# Patient Record
Sex: Female | Born: 1985 | Race: Black or African American | Hispanic: No | Marital: Single | Smoking: Never smoker
Health system: Southern US, Community
[De-identification: ages and names within clinical notes are randomized; demographics above are authoritative.]

## PROBLEM LIST (undated history)

## (undated) ENCOUNTER — Ambulatory Visit: Payer: PRIVATE HEALTH INSURANCE

## (undated) DIAGNOSIS — G43909 Migraine, unspecified, not intractable, without status migrainosus: Secondary | ICD-10-CM

---

## 2006-03-24 ENCOUNTER — Emergency Department (HOSPITAL_COMMUNITY): Admission: EM | Admit: 2006-03-24 | Discharge: 2006-03-24 | Payer: Self-pay | Admitting: Emergency Medicine

## 2009-04-22 ENCOUNTER — Encounter: Payer: Self-pay | Admitting: Obstetrics & Gynecology

## 2009-04-22 ENCOUNTER — Inpatient Hospital Stay (HOSPITAL_COMMUNITY): Admission: AD | Admit: 2009-04-22 | Discharge: 2009-04-22 | Payer: Self-pay | Admitting: Obstetrics

## 2009-05-20 ENCOUNTER — Ambulatory Visit (HOSPITAL_COMMUNITY): Admission: RE | Admit: 2009-05-20 | Discharge: 2009-05-20 | Payer: Self-pay | Admitting: Obstetrics & Gynecology

## 2009-07-08 ENCOUNTER — Inpatient Hospital Stay (HOSPITAL_COMMUNITY): Admission: AD | Admit: 2009-07-08 | Discharge: 2009-07-10 | Payer: Self-pay | Admitting: Obstetrics & Gynecology

## 2009-08-13 ENCOUNTER — Inpatient Hospital Stay (HOSPITAL_COMMUNITY): Admission: AD | Admit: 2009-08-13 | Discharge: 2009-08-13 | Payer: Self-pay | Admitting: Obstetrics

## 2010-01-02 IMAGING — US US OB FOLLOW-UP
1 series · 18 of 28 positions shown · non-contrast
Comparison: none

OBSTETRICAL ULTRASOUND:
 This ultrasound was performed in The [HOSPITAL], and the [facility] report will be stored to [REDACTED] PACS.

[Series 1: us ob follow-up · 18 of 46 slices shown]
[im 1/46]
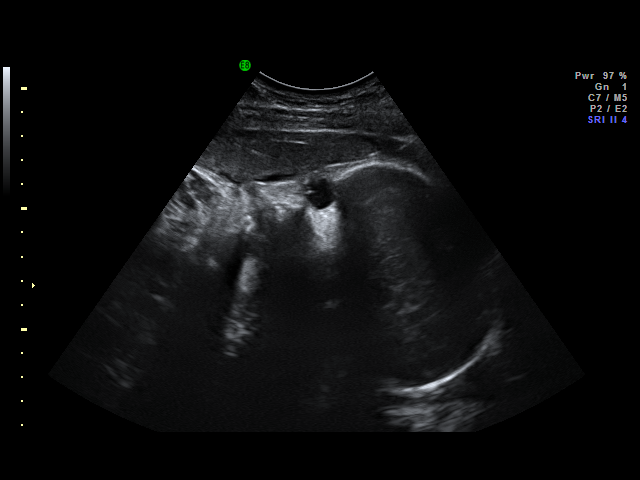
[im 4/46]
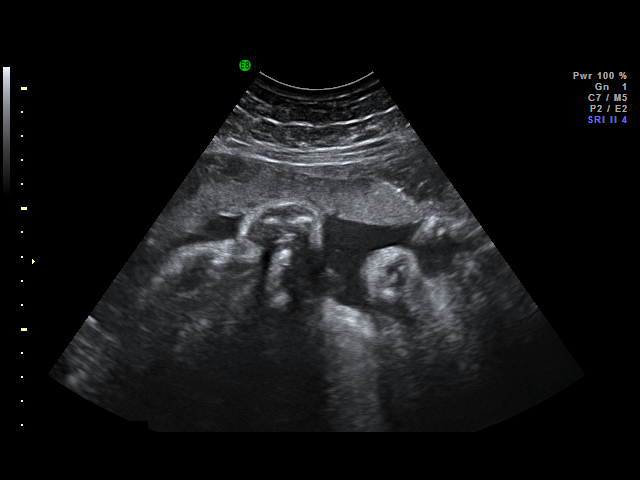
[im 6/46]
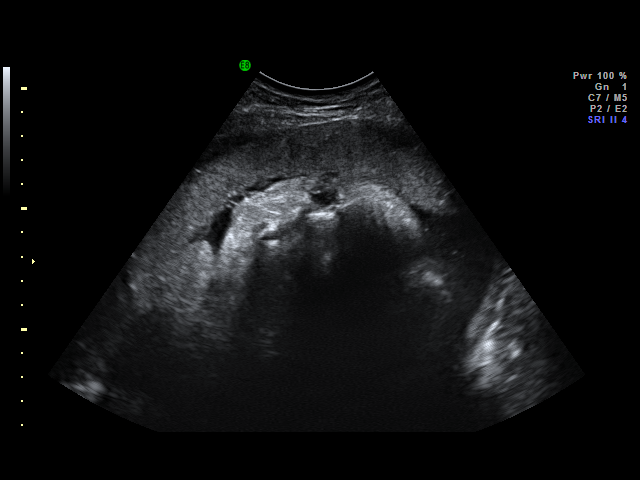
[im 9/46]
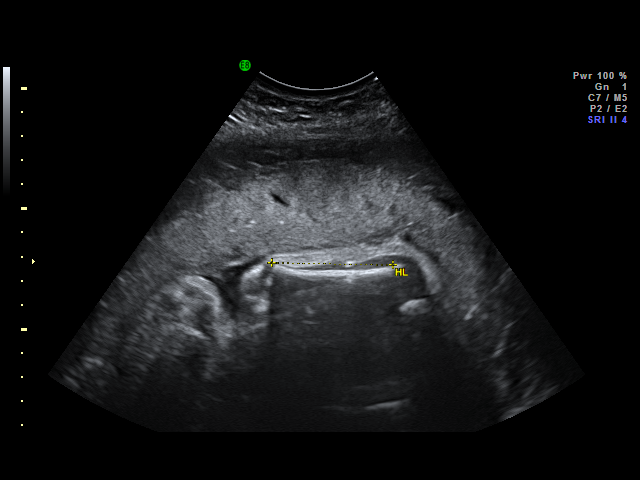
[im 12/46]
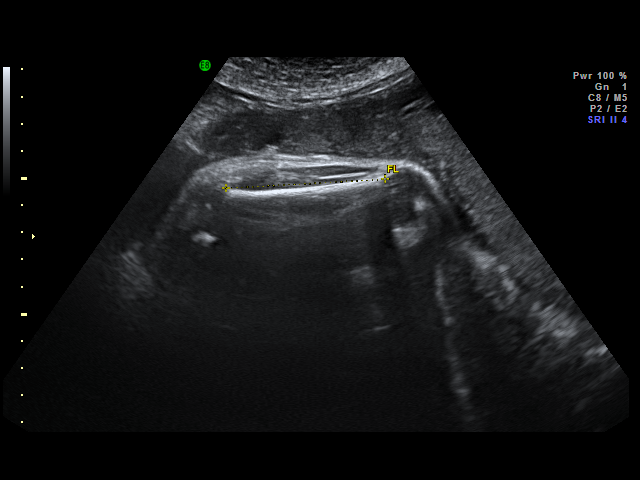
[im 14/46]
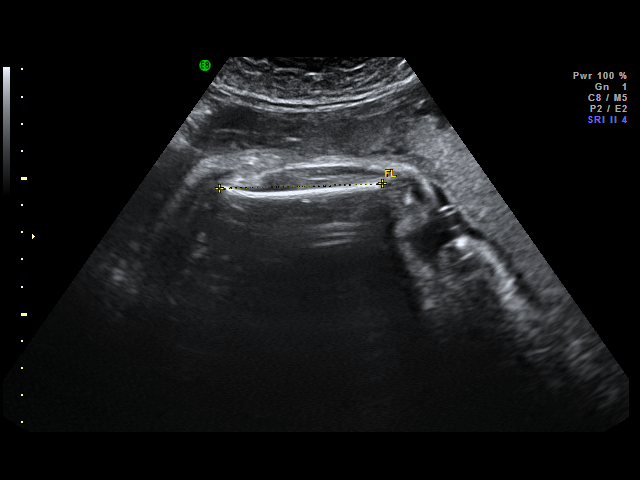
[im 17/46]
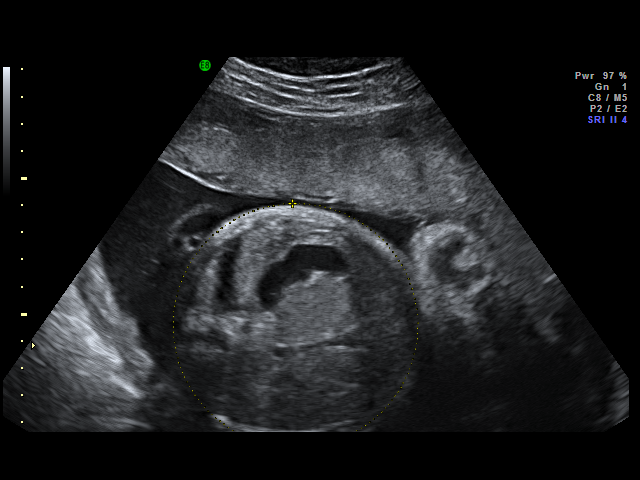
[im 19/46]
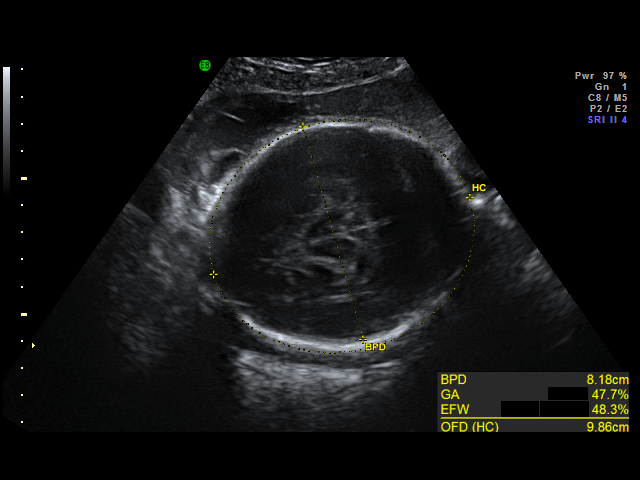
[im 22/46]
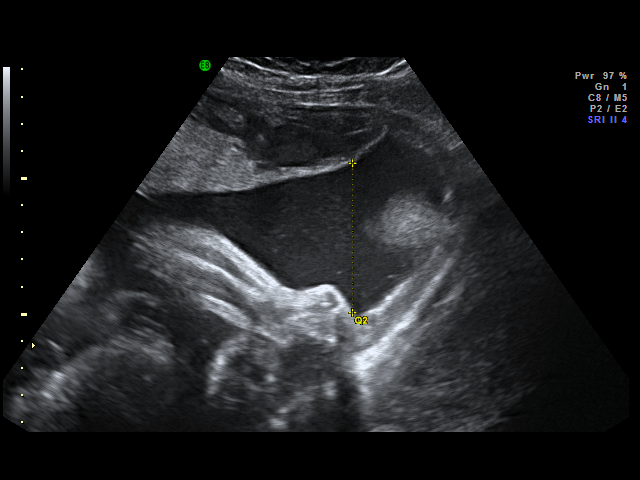
[im 24/46]
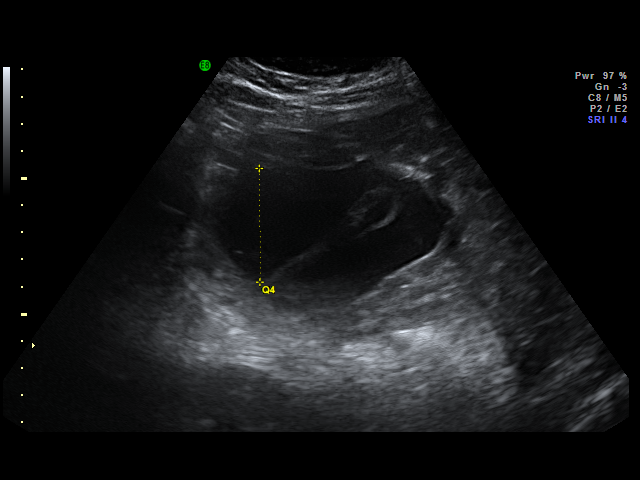
[im 27/46]
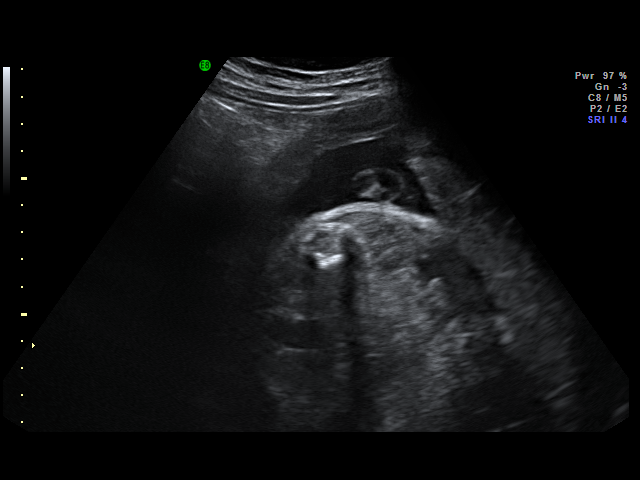
[im 29/46]
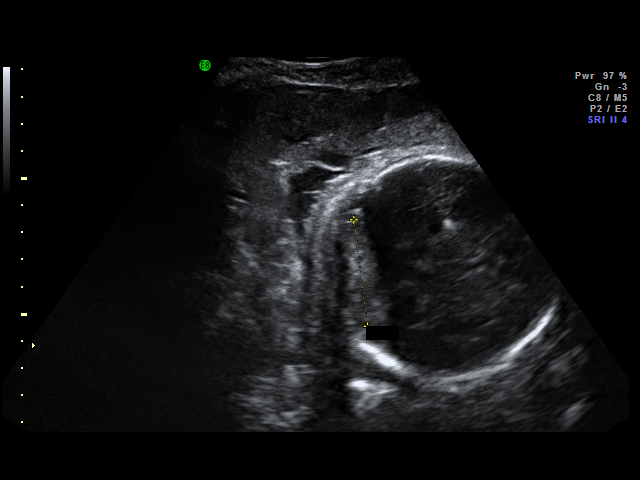
[im 32/46]
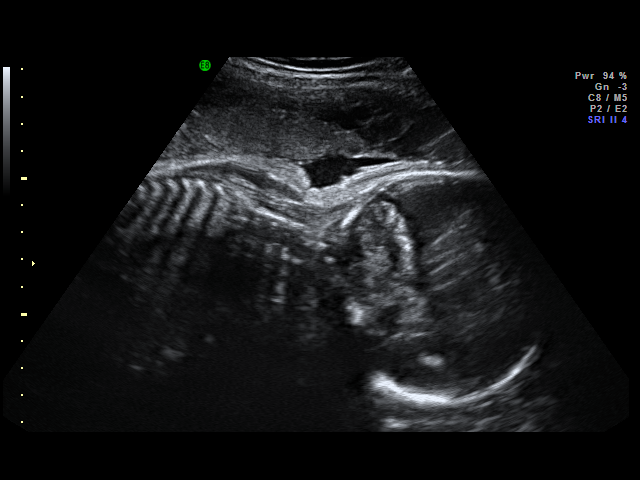
[im 36/46]
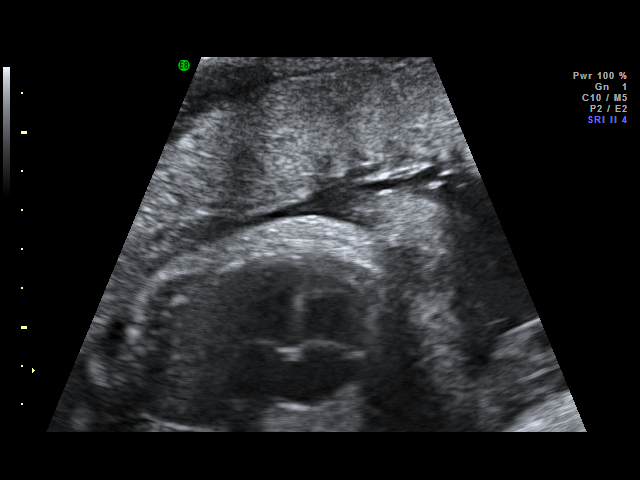
[im 37/46]
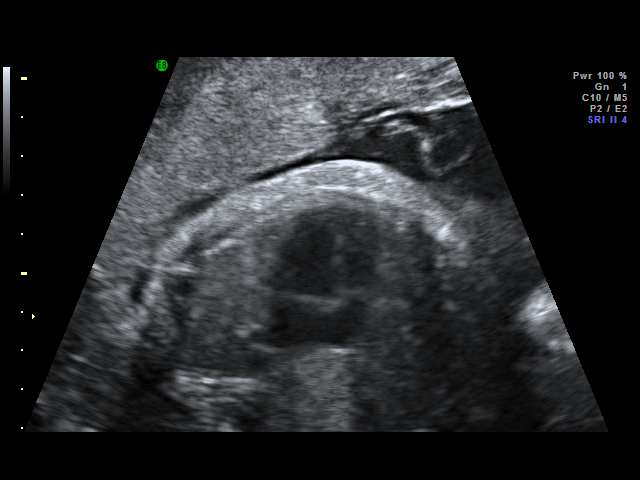
[im 41/46]
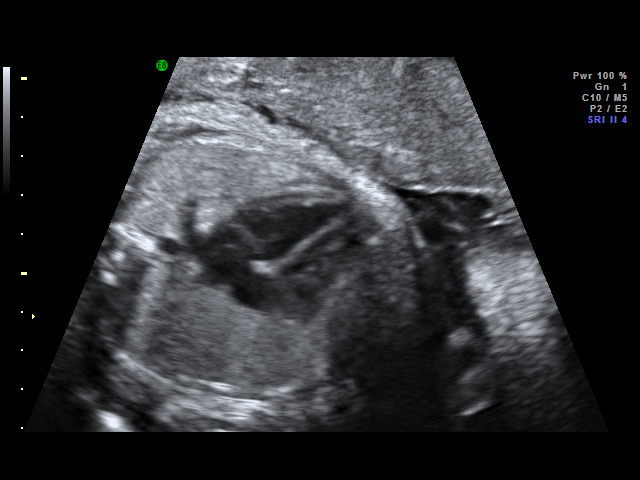
[im 42/46]
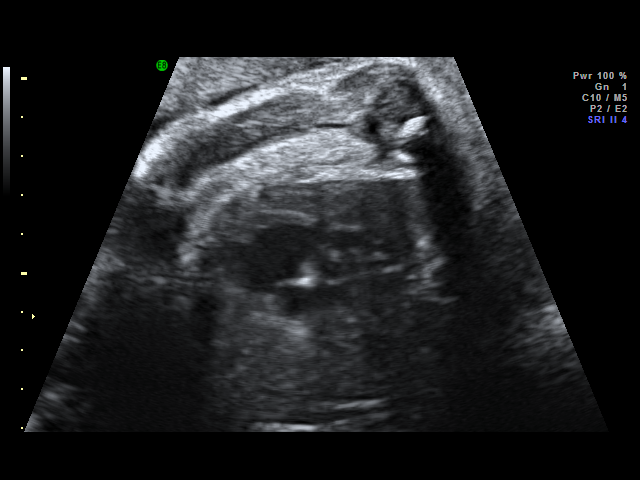
[im 46/46]
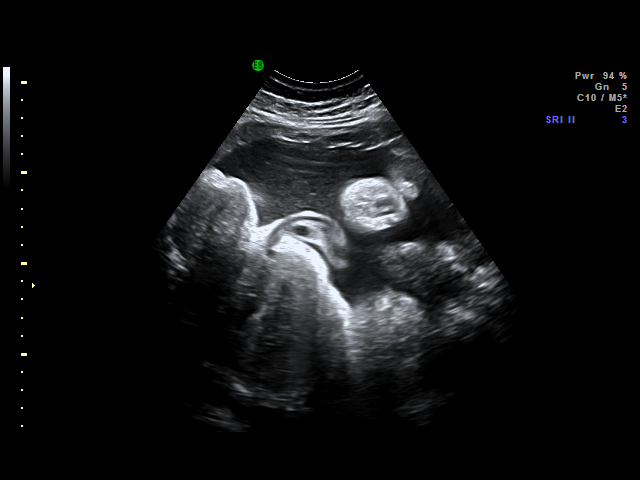

[18 of 28 positions shown; findings below may reference images not displayed]

IMPRESSION: [facility] has also been faxed to the ordering physician.

## 2010-02-25 ENCOUNTER — Emergency Department (HOSPITAL_COMMUNITY): Admission: EM | Admit: 2010-02-25 | Discharge: 2010-02-25 | Payer: Self-pay | Admitting: Family Medicine

## 2010-11-06 LAB — POCT URINALYSIS DIP (DEVICE)
Hgb urine dipstick: NEGATIVE
Protein, ur: 30 mg/dL — AB
Specific Gravity, Urine: 1.025 (ref 1.005–1.030)
Urobilinogen, UA: 1 mg/dL (ref 0.0–1.0)
pH: 5.5 (ref 5.0–8.0)

## 2010-11-06 LAB — WET PREP, GENITAL: Yeast Wet Prep HPF POC: NONE SEEN

## 2010-11-21 LAB — CBC
HCT: 37.3 % (ref 36.0–46.0)
MCHC: 32.5 g/dL (ref 30.0–36.0)
MCV: 90.5 fL (ref 78.0–100.0)
RBC: 4.12 MIL/uL (ref 3.87–5.11)
WBC: 6.7 10*3/uL (ref 4.0–10.5)

## 2010-11-23 LAB — CBC
HCT: 37.1 % (ref 36.0–46.0)
Hemoglobin: 9.8 g/dL — ABNORMAL LOW (ref 12.0–15.0)
MCV: 91.3 fL (ref 78.0–100.0)
Platelets: 283 10*3/uL (ref 150–400)
RBC: 3.22 MIL/uL — ABNORMAL LOW (ref 3.87–5.11)
RDW: 14 % (ref 11.5–15.5)
RDW: 14 % (ref 11.5–15.5)
WBC: 15.3 10*3/uL — ABNORMAL HIGH (ref 4.0–10.5)

## 2010-11-23 LAB — URIC ACID: Uric Acid, Serum: 4.7 mg/dL (ref 2.4–7.0)

## 2010-11-23 LAB — COMPREHENSIVE METABOLIC PANEL
BUN: 6 mg/dL (ref 6–23)
Calcium: 9.3 mg/dL (ref 8.4–10.5)
Creatinine, Ser: 0.62 mg/dL (ref 0.4–1.2)
Glucose, Bld: 68 mg/dL — ABNORMAL LOW (ref 70–99)
Total Protein: 6.6 g/dL (ref 6.0–8.3)

## 2010-11-23 LAB — LACTATE DEHYDROGENASE: LDH: 184 U/L (ref 94–250)

## 2011-03-13 ENCOUNTER — Emergency Department (HOSPITAL_COMMUNITY)
Admission: EM | Admit: 2011-03-13 | Discharge: 2011-03-14 | Discharge status: Against Medical Advice | Payer: Self-pay | Attending: Emergency Medicine | Admitting: Emergency Medicine

## 2011-03-13 DIAGNOSIS — Z0389 Encounter for observation for other suspected diseases and conditions ruled out: Secondary | ICD-10-CM | POA: Insufficient documentation

## 2011-03-13 LAB — URINALYSIS, ROUTINE W REFLEX MICROSCOPIC
Glucose, UA: NEGATIVE mg/dL
Protein, ur: NEGATIVE mg/dL
pH: 5.5 (ref 5.0–8.0)

## 2011-03-13 LAB — URINE MICROSCOPIC-ADD ON

## 2011-03-14 ENCOUNTER — Inpatient Hospital Stay (INDEPENDENT_AMBULATORY_CARE_PROVIDER_SITE_OTHER)
Admission: RE | Admit: 2011-03-14 | Discharge: 2011-03-14 | Disposition: A | Discharge status: Home / Self Care | Payer: 59 | Source: Ambulatory Visit | Attending: Emergency Medicine | Admitting: Emergency Medicine

## 2011-03-14 ENCOUNTER — Ambulatory Visit (INDEPENDENT_AMBULATORY_CARE_PROVIDER_SITE_OTHER): Payer: 59

## 2011-03-14 DIAGNOSIS — R109 Unspecified abdominal pain: Secondary | ICD-10-CM

## 2011-03-14 LAB — POCT URINALYSIS DIP (DEVICE)
Ketones, ur: NEGATIVE mg/dL
Leukocytes, UA: NEGATIVE
Protein, ur: 100 mg/dL — AB
pH: 6 (ref 5.0–8.0)

## 2011-03-14 LAB — WET PREP, GENITAL: Clue Cells Wet Prep HPF POC: NONE SEEN

## 2011-12-04 ENCOUNTER — Telehealth: Payer: Self-pay | Admitting: Obstetrics and Gynecology

## 2011-12-04 NOTE — Telephone Encounter (Signed)
Routed to triage- 5:15

## 2011-12-05 ENCOUNTER — Telehealth: Payer: Self-pay | Admitting: Obstetrics and Gynecology

## 2011-12-07 NOTE — Telephone Encounter (Signed)
TC to pt. LM to return call. LM appt. Available this AM.

## 2011-12-13 NOTE — Telephone Encounter (Signed)
No response from pt. F/U ended. 

## 2013-02-02 ENCOUNTER — Emergency Department (HOSPITAL_COMMUNITY)
Admission: EM | Admit: 2013-02-02 | Discharge: 2013-02-02 | Disposition: A | Discharge status: Home / Self Care | Payer: Self-pay | Attending: Emergency Medicine | Admitting: Emergency Medicine

## 2013-02-02 ENCOUNTER — Encounter (HOSPITAL_COMMUNITY): Payer: Self-pay | Admitting: *Deleted

## 2013-02-02 DIAGNOSIS — M542 Cervicalgia: Secondary | ICD-10-CM | POA: Insufficient documentation

## 2013-02-02 DIAGNOSIS — J020 Streptococcal pharyngitis: Secondary | ICD-10-CM | POA: Insufficient documentation

## 2013-02-02 DIAGNOSIS — R1310 Dysphagia, unspecified: Secondary | ICD-10-CM | POA: Insufficient documentation

## 2013-02-02 DIAGNOSIS — R599 Enlarged lymph nodes, unspecified: Secondary | ICD-10-CM | POA: Insufficient documentation

## 2013-02-02 DIAGNOSIS — H9209 Otalgia, unspecified ear: Secondary | ICD-10-CM | POA: Insufficient documentation

## 2013-02-02 LAB — RAPID STREP SCREEN (MED CTR MEBANE ONLY): Streptococcus, Group A Screen (Direct): POSITIVE — AB

## 2013-02-02 MED ORDER — HYDROCODONE-HOMATROPINE 5-1.5 MG/5ML PO SYRP: 2.5000 mL | ORAL_SOLUTION | Freq: Four times a day (QID) | ORAL | Status: DC | PRN

## 2013-02-02 MED ORDER — PENICILLIN G BENZATHINE 1200000 UNIT/2ML IM SUSP
1.2000 10*6.[IU] | Freq: Once | INTRAMUSCULAR | Status: AC
  Administered 2013-02-02: 1.2 10*6.[IU] via INTRAMUSCULAR
  Filled 2013-02-02: qty 2

## 2013-02-02 MED ORDER — DEXAMETHASONE SODIUM PHOSPHATE 10 MG/ML IJ SOLN
10.0000 mg | Freq: Once | INTRAMUSCULAR | Status: AC
  Administered 2013-02-02: 10 mg via INTRAMUSCULAR
  Filled 2013-02-02: qty 1

## 2013-02-02 NOTE — ED Notes (Signed)
Pt c/o swelling right side of neck x 3 days; earache today; pain with swallowing

## 2013-02-02 NOTE — ED Provider Notes (Signed)
History     CSN: [id number]  Arrival date & time 02/02/13  0534   First MD Initiated Contact with Patient 02/02/13 [phone]      Chief Complaint  Patient presents with  . Neck Pain    (Consider location/radiation/quality/duration/timing/severity/associated sxs/prior treatment) HPI  Patient is a 27 yo female presented to the emergency room for 3 days of worsening sore throat with right ear pain. Rates pain 9/10. Swallowing and eating aggravate pain. No alleviating factors including OTC medications. Denies fevers, chills, nausea, vomiting, cough.   History reviewed. No pertinent past medical history.  History reviewed. No pertinent past surgical history.  No family history on file.  History  Substance Use Topics  . Smoking status: Never Smoker   . Smokeless tobacco: Not on file  . Alcohol Use: No    OB History   Grav Para Term Preterm Abortions TAB SAB Ect Mult Living                  Review of Systems  Constitutional: Negative for fever and chills.  HENT: Positive for ear pain and sore throat. Negative for congestion, rhinorrhea, drooling, trouble swallowing, neck pain, neck stiffness, voice change and sinus pressure.   Respiratory: Negative for cough.   Neurological: Negative for headaches.    Allergies  Review of patient's allergies indicates no known allergies.  Home Medications   Current Outpatient Rx  Name  Route  Sig  Dispense  Refill  . Aspirin-Salicylamide-Caffeine (BC HEADACHE POWDER PO)   Oral   Take 1 Package by mouth 2 (two) times daily as needed (pain or headache).         Marland Kitchen ibuprofen (ADVIL,MOTRIN) 800 MG tablet   Oral   Take 800 mg by mouth every 8 (eight) hours as needed for pain.         . valACYclovir (VALTREX) 1000 MG tablet   Oral   Take 500 mg by mouth every morning.         Marland Kitchen HYDROcodone-homatropine (HYCODAN) 5-1.5 MG/5ML syrup   Oral   Take 2.5 mLs by mouth every 6 (six) hours as needed for cough.   120 mL   0     BP  125/70  Pulse 79  Temp(Src) 98.3 F (36.8 C) (Oral)  Resp 18  SpO2 100%  Physical Exam  Constitutional: She is oriented to person, place, and time. She appears well-developed and well-nourished. No distress.  HENT:  Head: Normocephalic and atraumatic. No trismus in the jaw.  Mouth/Throat: Uvula is midline and mucous membranes are normal. Normal dentition. No edematous. Posterior oropharyngeal erythema present. No oropharyngeal exudate, posterior oropharyngeal edema or tonsillar abscesses.  Eyes: Conjunctivae are normal.  Neck: Neck supple.  Lymphadenopathy:    She has cervical adenopathy.  Neurological: She is alert and oriented to person, place, and time.  Skin: Skin is warm and dry. She is not diaphoretic.  Psychiatric: She has a normal mood and affect.    ED Course  Procedures (including critical care time)  Medications  penicillin g benzathine (BICILLIN LA) 1200000 UNIT/2ML injection 1.2 Million Units (1.2 Million Units Intramuscular Given 02/02/13 0713)  dexamethasone (DECADRON) injection 10 mg (10 mg Intramuscular Given 02/02/13 0713)     Labs Reviewed  RAPID STREP SCREEN - Abnormal; Notable for the following:    Streptococcus, Group A Screen (Direct) POSITIVE (*)    All other components within normal limits   No results found.   1. Strep pharyngitis  MDM  Pt febrile with cervical lymphadenopathy, & dysphagia; diagnosis of strep. Treated in the Ed with steroids, NSAIDs, Pain medication and PCN IM.  Pt appears mildly dehydrated, discussed importance of water rehydration. Presentation non concerning for PTA or infxn spread to soft tissue. No trismus or uvula deviation. Specific return precautions discussed. Pt able to drink water in ED without difficulty with intact air way. Recommended PCP follow up. Patient is stable at time of discharge          Jeannetta Ellis, PA-C 02/02/13 1610

## 2013-02-02 NOTE — ED Notes (Signed)
Strep test sent to lab

## 2013-02-02 NOTE — ED Notes (Signed)
Pt states she has sore throat on right side and her right ear is now hurting and it is hard to swallow,  Pain 10/10

## 2013-02-03 NOTE — ED Provider Notes (Signed)
Medical screening examination/treatment/procedure(s) were performed by non-physician practitioner and as supervising physician I was immediately available for consultation/collaboration.   Gavin Pound. Oletta Lamas, MD 02/03/13 [phone]

## 2015-12-09 ENCOUNTER — Emergency Department (HOSPITAL_COMMUNITY)
Admission: EM | Admit: 2015-12-09 | Discharge: 2015-12-09 | Disposition: A | Discharge status: Home / Self Care | Payer: BLUE CROSS/BLUE SHIELD | Attending: Emergency Medicine | Admitting: Emergency Medicine

## 2015-12-09 ENCOUNTER — Emergency Department (HOSPITAL_COMMUNITY): Payer: BLUE CROSS/BLUE SHIELD

## 2015-12-09 ENCOUNTER — Encounter (HOSPITAL_COMMUNITY): Payer: Self-pay | Admitting: Emergency Medicine

## 2015-12-09 DIAGNOSIS — G43809 Other migraine, not intractable, without status migrainosus: Secondary | ICD-10-CM | POA: Insufficient documentation

## 2015-12-09 DIAGNOSIS — R51 Headache: Secondary | ICD-10-CM | POA: Diagnosis present

## 2015-12-09 DIAGNOSIS — Z3202 Encounter for pregnancy test, result negative: Secondary | ICD-10-CM | POA: Insufficient documentation

## 2015-12-09 LAB — I-STAT BETA HCG BLOOD, ED (MC, WL, AP ONLY): I-stat hCG, quantitative: <5 m[IU]/mL (ref <5)

## 2015-12-09 MED ORDER — DIPHENHYDRAMINE HCL 50 MG/ML IJ SOLN
12.5000 mg | Freq: Once | INTRAMUSCULAR | Status: AC
  Administered 2015-12-09: 12.5 mg via INTRAVENOUS
  Filled 2015-12-09: qty 1

## 2015-12-09 MED ORDER — SUMATRIPTAN SUCCINATE 50 MG PO TABS: 50.0000 mg | ORAL_TABLET | Freq: Every day | ORAL | Status: DC | PRN

## 2015-12-09 MED ORDER — PROCHLORPERAZINE EDISYLATE 5 MG/ML IJ SOLN
10.0000 mg | Freq: Once | INTRAMUSCULAR | Status: AC
  Administered 2015-12-09: 10 mg via INTRAVENOUS
  Filled 2015-12-09: qty 2

## 2015-12-09 MED ORDER — KETOROLAC TROMETHAMINE 30 MG/ML IJ SOLN
30.0000 mg | Freq: Once | INTRAMUSCULAR | Status: AC
  Administered 2015-12-09: 30 mg via INTRAVENOUS
  Filled 2015-12-09: qty 1

## 2015-12-09 MED ORDER — SODIUM CHLORIDE 0.9 % IV SOLN
INTRAVENOUS | Status: DC
  Administered 2015-12-09: 125 mL/h via INTRAVENOUS

## 2015-12-09 NOTE — ED Notes (Signed)
Patient states "bad headache x 2 days".  Patient states gets "bad headaches 2 x weekly for 6 years".  Patient states has not seen a doctor for same.  Patient states she has been taking bc powders at home but gets no relief from pain.   Patient states nausea, lights hurts her eyes, noise bothers her.   Patient states has never been diagnosed with migraines.

## 2015-12-09 NOTE — ED Provider Notes (Signed)
CSN: [id number]     Arrival date & time 12/09/15  [id number] History   First MD Initiated Contact with Patient 12/09/15 [phone]     Chief Complaint  Patient presents with  . Headache   HPI Patient presents to the emergency room for complaints of a headache. The patient has had trouble with headaches at least twice weekly for the past 6 years. Patient has tried over-the-counter medications. I offer some relief however the headaches continue to return. Over the last couple of days the headaches have increased in intensity. His had some trouble with nausea, photophobia and phonophobia. She denies any numbness or weakness. No fevers or chills. No neck pain or stiffness. No recent injuries. Patient has not seen a doctor in the past because she is afraid of what they might say. History reviewed. No pertinent past medical history. History reviewed. No pertinent past surgical history. No family history on file. Social History  Substance Use Topics  . Smoking status: Never Smoker   . Smokeless tobacco: None  . Alcohol Use: Yes     Comment: socially   OB History    No data available     Review of Systems  All other systems reviewed and are negative.     Allergies  Review of patient's allergies indicates no known allergies.  Home Medications   Prior to Admission medications   Medication Sig Start Date End Date Taking? Authorizing Provider  Aspirin-Salicylamide-Caffeine (BC HEADACHE POWDER PO) Take 1 Package by mouth 2 (two) times daily as needed (pain or headache).   Yes Historical Provider, MD  ibuprofen (ADVIL,MOTRIN) 800 MG tablet Take 800 mg by mouth every 8 (eight) hours as needed for pain.   Yes Historical Provider, MD  SUMAtriptan (IMITREX) 50 MG tablet Take 1 tablet (50 mg total) by mouth daily as needed for migraine. May repeat once in 2 hours if headache persists or recurs. 12/09/15   Linwood DibblesJon Wendee Hata, MD   BP 107/63 mmHg  Pulse 69  Temp(Src) 98.3 F (36.8 C) (Oral)  Resp 17  Ht 5\' 6"   (1.676 m)  Wt 83.915 kg  BMI 29.87 kg/m2  SpO2 99%  LMP 12/02/2015 Physical Exam  Constitutional: She is oriented to person, place, and time. She appears well-developed and well-nourished. No distress.  HENT:  Head: Normocephalic and atraumatic.  Right Ear: External ear normal.  Left Ear: External ear normal.  Mouth/Throat: Oropharynx is clear and moist.  Eyes: Conjunctivae are normal. Right eye exhibits no discharge. Left eye exhibits no discharge. No scleral icterus.  Neck: Normal range of motion. Neck supple. No tracheal deviation present.  Cardiovascular: Normal rate, regular rhythm and intact distal pulses.   Pulmonary/Chest: Effort normal and breath sounds normal. No stridor. No respiratory distress. She has no wheezes. She has no rales.  Abdominal: Soft. Bowel sounds are normal. She exhibits no distension. There is no tenderness. There is no rebound and no guarding.  Musculoskeletal: She exhibits no edema or tenderness.  Neurological: She is alert and oriented to person, place, and time. She has normal strength. No cranial nerve deficit (No facial droop, extraocular movements intact, tongue midline ) or sensory deficit. She exhibits normal muscle tone. She displays no seizure activity. Coordination normal.  No pronator drift bilateral upper extrem, able to hold both legs off bed for 5 seconds, sensation intact in all extremities, no visual field cuts, no left or right sided neglect, normal finger-nose exam bilaterally, no nystagmus noted   Skin: Skin is warm and dry.  No rash noted.  Psychiatric: She has a normal mood and affect.  Nursing note and vitals reviewed.   ED Course  Procedures (including critical care time) Labs Review Labs Reviewed  I-STAT BETA HCG BLOOD, ED ([facility], [facility], [facility] ONLY)    Imaging Review Ct Head Wo Contrast  12/09/2015  CLINICAL DATA:  Headache for 2 days, history of migraines EXAM: CT HEAD WITHOUT CONTRAST TECHNIQUE: Contiguous axial images were obtained  from the base of the skull through the vertex without intravenous contrast. COMPARISON:  None. FINDINGS: No mass lesion. No midline shift. No acute hemorrhage or hematoma. No extra-axial fluid collections. No evidence of acute infarction. Calvarium intact. Visualized portions of the paranasal sinuses clear. IMPRESSION: Normal head CT Electronically Signed   By: Esperanza Heir M.D.   On: 12/09/2015 10:43   I have personally reviewed and evaluated these images and lab results as part of my medical decision-making.  Medications  0.9 %  sodium chloride infusion (125 mL/hr Intravenous New Bag/Given 12/09/15 1006)  prochlorperazine (COMPAZINE) injection 10 mg (10 mg Intravenous Given 12/09/15 1012)  diphenhydrAMINE (BENADRYL) injection 12.5 mg (12.5 mg Intravenous Given 12/09/15 1010)  ketorolac (TORADOL) 30 MG/ML injection 30 mg (30 mg Intravenous Given 12/09/15 1012)     MDM   Final diagnoses:  Other migraine without status migrainosus, not intractable    CT scan of the brain was performed because she's never had any imaging despite these chronic headaches for several years. CT scan does not show any evidence of tumor or hemorrhage.  Patient's symptoms are consistent with a migraine-type headache. Symptoms improved with IV medications. We'll discharge home with a prescription for Imitrex. I suggested follow-up with a primary care doctor for further treatment of her headaches. I discussed the nature of migraine treatment and the variety of medications that can be used.   Linwood Dibbles, MD 12/09/15 1130

## 2015-12-09 NOTE — Discharge Instructions (Signed)

## 2016-01-29 ENCOUNTER — Ambulatory Visit (INDEPENDENT_AMBULATORY_CARE_PROVIDER_SITE_OTHER): Payer: BLUE CROSS/BLUE SHIELD | Admitting: Osteopathic Medicine

## 2016-01-29 VITALS — BP 110/68 | HR 77 | Temp 98.6°F | Resp 16 | Ht 65.75 in | Wt 183.0 lb

## 2016-01-29 DIAGNOSIS — K029 Dental caries, unspecified: Secondary | ICD-10-CM | POA: Diagnosis not present

## 2016-01-29 MED ORDER — KETOROLAC TROMETHAMINE 60 MG/2ML IM SOLN
60.0000 mg | Freq: Once | INTRAMUSCULAR | Status: AC
  Administered 2016-01-29: 60 mg via INTRAMUSCULAR

## 2016-01-29 MED ORDER — TRAMADOL HCL 50 MG PO TABS: 50.0000 mg | ORAL_TABLET | Freq: Four times a day (QID) | ORAL | Status: AC | PRN

## 2016-01-29 MED ORDER — TRAMADOL HCL 50 MG PO TABS: 50.0000 mg | ORAL_TABLET | Freq: Four times a day (QID) | ORAL | Status: DC | PRN

## 2016-01-29 NOTE — Progress Notes (Signed)
HPI: Alexis Leonard is a 30 y.o. female who presents to [facility] today for chief complaint of:  Chief Complaint  Patient presents with  . Dental Pain    Right side, supposed to have tooth removed this upcoming Tuesday    Dental pain . Location: R upper side of mouth . Quality: throbbing pain, sharp . Duration: Several days . Timing: Worse with food or drink . Modifying factors: Has tried BC powders, Goody powders, no effect . Assoc signs/symptoms: No fever or chills, no swelling or drainage from the gums, no sore throat or enlarged lymph nodes   Past medical, social and family history reviewed: No past medical history on file. No past surgical history on file. Social History  Substance Use Topics  . Smoking status: Never Smoker   . Smokeless tobacco: Not on file  . Alcohol Use: Yes     Comment: socially   No family history on file.  Current Outpatient Prescriptions  Medication Sig Dispense Refill  . Aspirin-Salicylamide-Caffeine (BC HEADACHE POWDER PO) Take 1 Package by mouth 2 (two) times daily as needed (pain or headache). Reported on 01/29/2016    . ibuprofen (ADVIL,MOTRIN) 800 MG tablet Take 800 mg by mouth every 8 (eight) hours as needed for pain. Reported on 01/29/2016    . SUMAtriptan (IMITREX) 50 MG tablet Take 1 tablet (50 mg total) by mouth daily as needed for migraine. May repeat once in 2 hours if headache persists or recurs. (Patient not taking: Reported on 01/29/2016) 14 tablet 0   No current facility-administered medications for this visit.   No Known Allergies    Review of Systems: CONSTITUTIONAL:  No  fever, no chills HEAD/EYES/EARS/NOSE/THROAT: No  headache, no vision change, no hearing change, No  sore throat, No  sinus pressure, dental pain as noted per history of present illness CARDIAC: No  chest pain  Exam:  BP 110/68 mmHg  Pulse 77  Temp(Src) 98.6 F (37 C) (Oral)  Resp 16  Ht 5' 5.75" (1.67 m)  Wt 183 lb (83.008  kg)  BMI 29.76 kg/m2  SpO2 98% Constitutional: VS see above. General Appearance: alert, well-developed, well-nourished, NAD Eyes: Normal lids and conjunctive, non-icteric sclera Ears, Nose, Mouth, Throat: MMM, Normal external inspection ears/nares/mouth/lips/gums, positive dental caries on right upper first premolar, appears chipped, other caries several teeth,, no edema/erythema or drainage from the gums, no foul odor. Pharynx no erythema, no exudate.  Neck: No masses, trachea midline. No thyroid enlargement/tenderness/mass appreciated. No lymphadenopathy Respiratory: Normal respiratory effort.    ASSESSMENT/PLAN: Toradol in office, can continue with ibuprofen at home, tramadol for breakthrough pain, patient encouraged to keep upcoming appointment with dentist, no further refills until seen by dentist, no suspicion for infection/abscess at this time  Pain due to dental caries - Plan: ketorolac (TORADOL) injection 60 mg, traMADol (ULTRAM) 50 MG tablet, DISCONTINUED: traMADol (ULTRAM) 50 MG tablet   Visit summary printed and instructions reviewed with the patient. All questions answered. Return if symptoms worsen or fail to improve, otherwisee follow up with Dentist this week as scheduled.

## 2016-01-29 NOTE — Patient Instructions (Addendum)
Dental Pain Dental pain may be caused by many things, including:  Tooth decay (cavities or caries). Cavities expose the nerve of your tooth to air and hot or cold temperatures. This can cause pain or discomfort.  Abscess or infection. A dental abscess is a collection of infected pus from a bacterial infection in the inner part of the tooth (pulp). It usually occurs at the end of the tooth's root.  Injury.  An unknown reason (idiopathic). Your pain may be mild or severe. It may only occur when:  You are chewing.  You are exposed to hot or cold temperature.  You are eating or drinking sugary foods or beverages, such as soda or candy. Your pain may also be constant. HOME CARE INSTRUCTIONS Watch your dental pain for any changes. The following actions may help to lessen any discomfort that you are feeling:  Take medicines only as directed by your dentist.  If you were prescribed an antibiotic medicine, finish all of it even if you start to feel better.  Keep all follow-up visits as directed by your dentist. This is important.  Do not apply heat to the outside of your face.  Rinse your mouth or gargle with salt water if directed by your dentist. This helps with pain and swelling.  You can make salt water by adding  tsp of salt to 1 cup of warm water.  Apply ice to the painful area of your face:  Put ice in a plastic bag.  Place a towel between your skin and the bag.  Leave the ice on for 20 minutes, 2-3 times per day.  Avoid foods or drinks that cause you pain, such as:  Very hot or very cold foods or drinks.  Sweet or sugary foods or drinks. SEEK MEDICAL CARE IF:  Your pain is not controlled with medicines.  Your symptoms are worse.  You have new symptoms. SEEK IMMEDIATE MEDICAL CARE IF:  You are unable to open your mouth.  You are having trouble breathing or swallowing.  You have a fever.  Your face, neck, or jaw is swollen.   This information is not  intended to replace advice given to you by your health care provider. Make sure you discuss any questions you have with your health care provider.   Document Released: 08/07/2005 Document Revised: 12/22/2014 Document Reviewed: 08/03/2014 Elsevier Interactive Patient Education 2016 [employer]     IF you received an x-ray today, you will receive an invoice from [facility]. Please contact [facility] at [phone] with questions or concerns regarding your invoice.   IF you received labwork today, you will receive an invoice from [facility]. Please contact [facility] at [phone] with questions or concerns regarding your invoice.   Our billing staff will not be able to assist you with questions regarding bills from these companies.  You will be contacted with the lab results as soon as they are available. The fastest way to get your results is to activate your My Chart account. Instructions are located on the last page of this paperwork. If you have not heard from us regarding the results in 2 weeks, please contact this office.     

## 2016-07-23 IMAGING — CT CT HEAD W/O CM
2 series · 15 of 30 positions shown, 17 images · non-contrast
Comparison: None.

CLINICAL DATA: Headache for 2 days, history of migraines

EXAM:
CT HEAD WITHOUT CONTRAST
TECHNIQUE: Contiguous axial images were obtained from the base of the skull
through the vertex without intravenous contrast.

[Series 2: head bone · axial · 0.39mm/px · z∈[-77,+41]mm · 8 of 75 slices shown]
[im 8/75  bone]
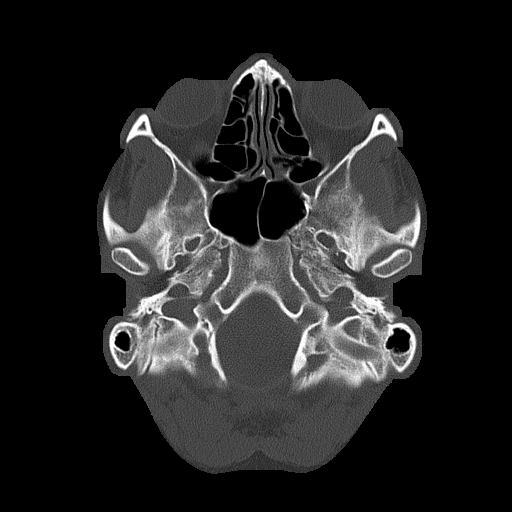
[im 15/75  bone]
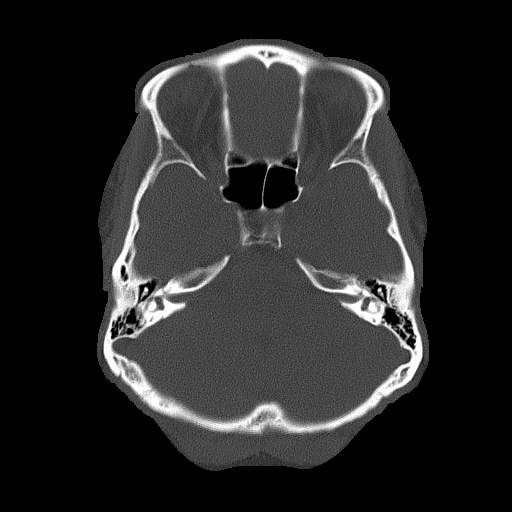
[im 23/75  bone]
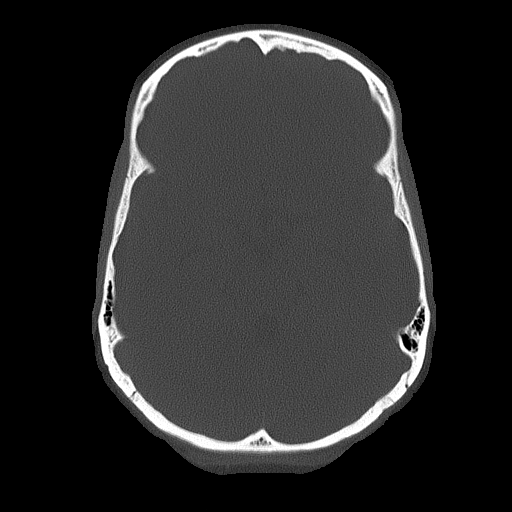
[im 34/75  bone]
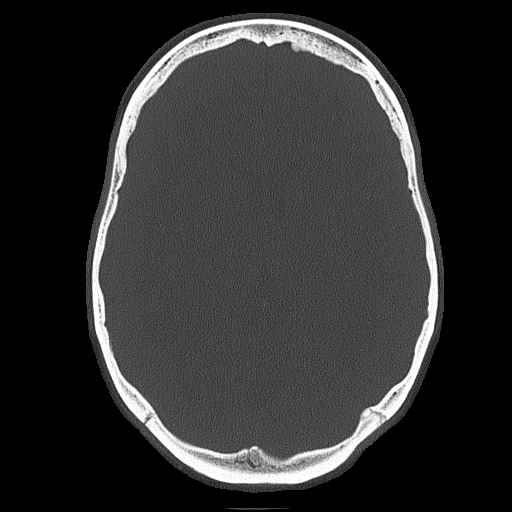
[im 41/75  bone]
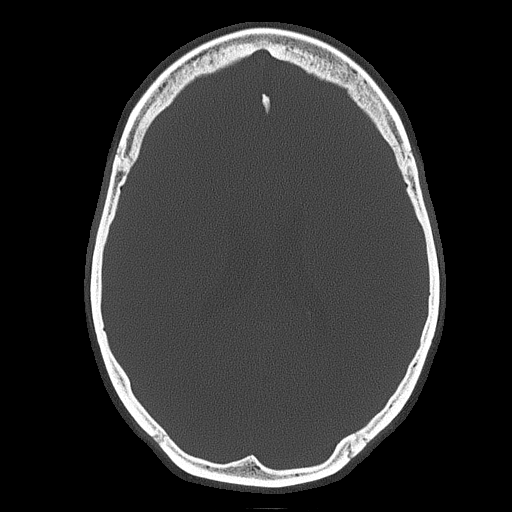
[im 52/75  bone]
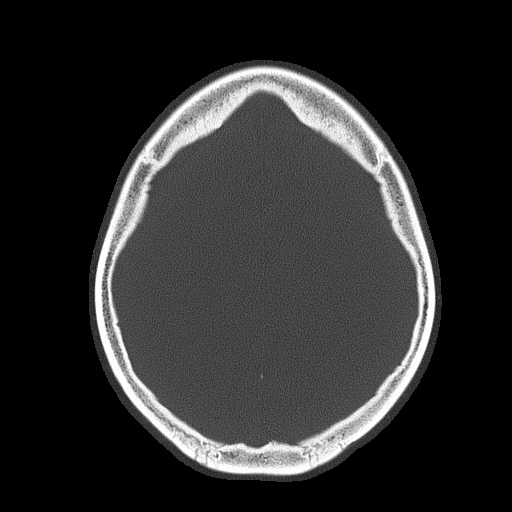
[im 60/75  bone]
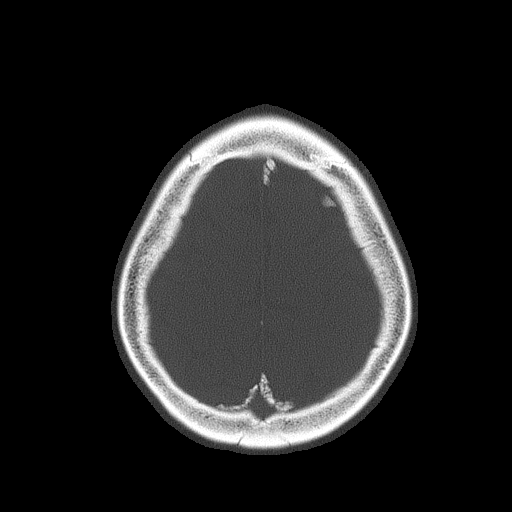
[im 67/75  bone]
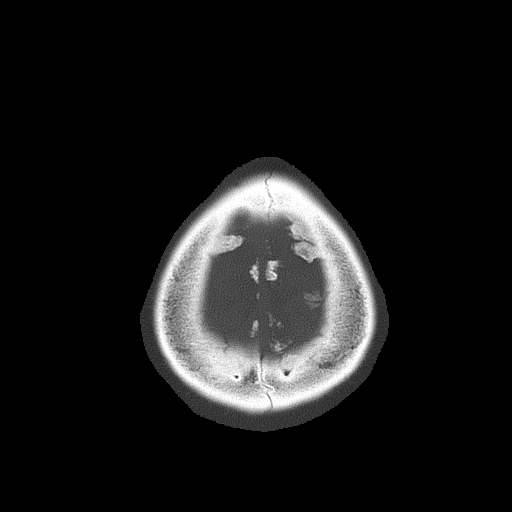

[Series 3: head without · axial · non-contrast · 0.39mm/px · z∈[-76,+34]mm · 7 of 30 slices shown, 9 images]
[im 4/30  brain]
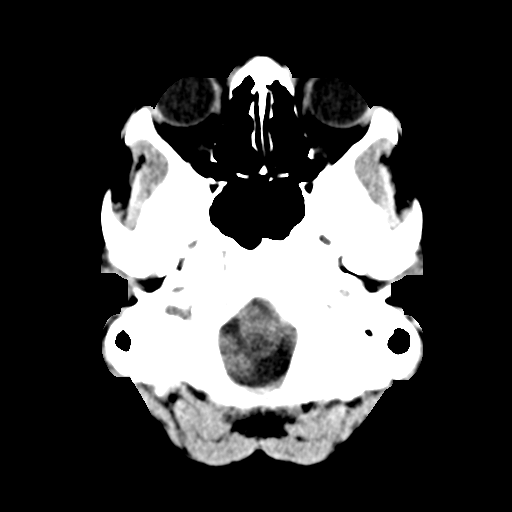
[im 4/30  bone]
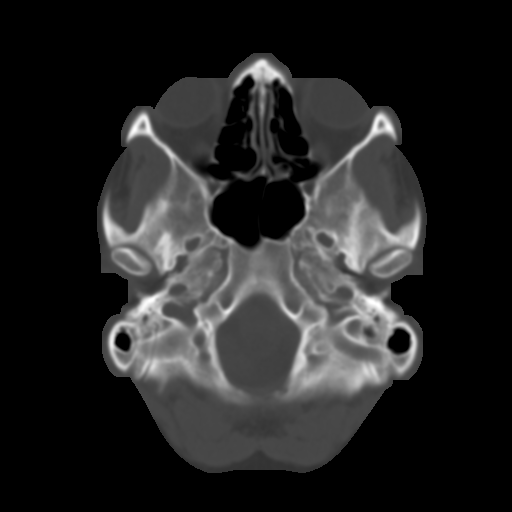
[im 8/30  brain]
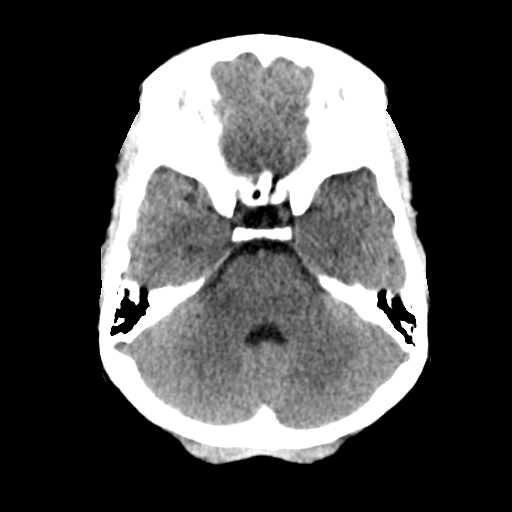
[im 11/30  brain]
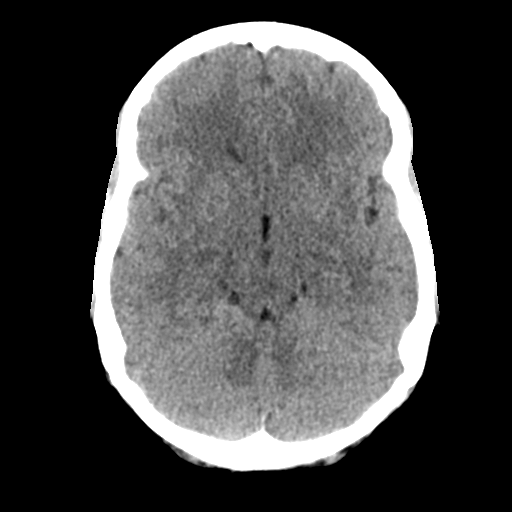
[im 15/30  brain]
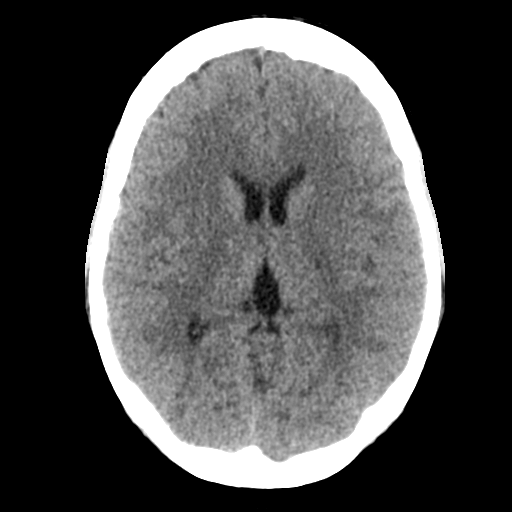
[im 19/30  brain]
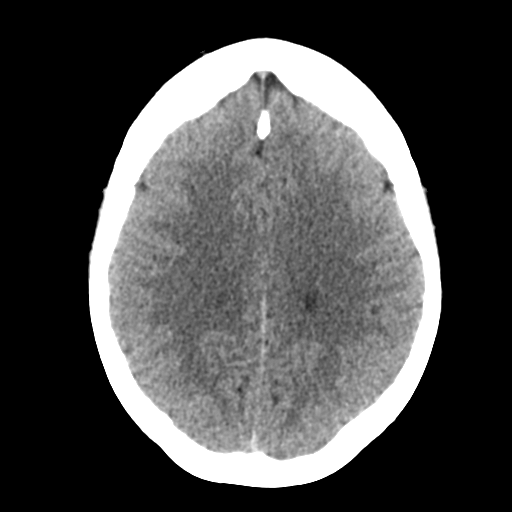
[im 19/30  bone]
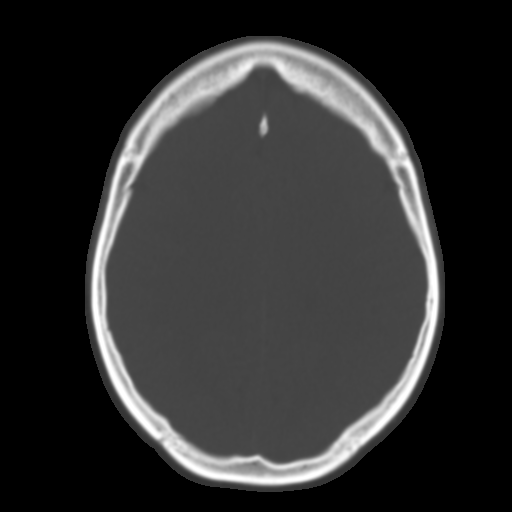
[im 22/30  brain]
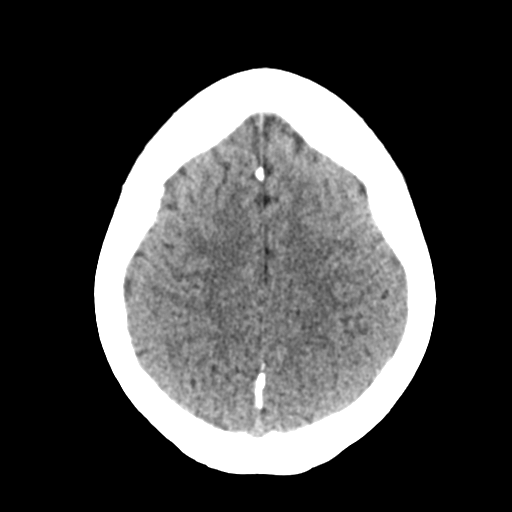
[im 26/30  brain]
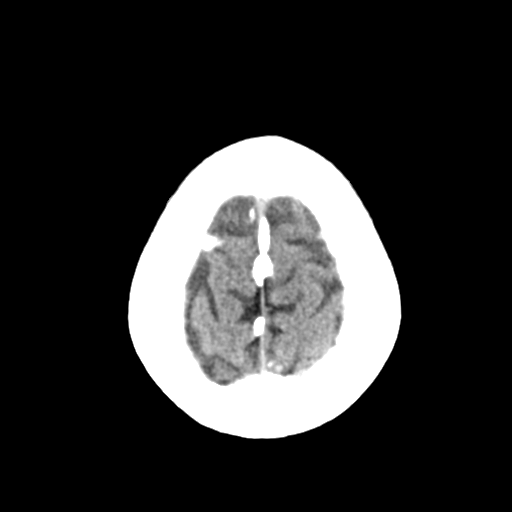

[15 of 30 positions shown; findings below may reference images not displayed]

FINDINGS: No mass lesion. No midline shift. No acute hemorrhage or hematoma.
No extra-axial fluid collections. No evidence of acute infarction.
Calvarium intact. Visualized portions of the paranasal sinuses
clear.
IMPRESSION: Normal head CT

## 2020-04-06 ENCOUNTER — Ambulatory Visit
Admission: EM | Admit: 2020-04-06 | Discharge: 2020-04-06 | Disposition: A | Discharge status: Home / Self Care | Payer: BC Managed Care – PPO | Attending: Physician Assistant | Admitting: Physician Assistant

## 2020-04-06 ENCOUNTER — Other Ambulatory Visit: Payer: Self-pay

## 2020-04-06 DIAGNOSIS — R059 Cough, unspecified: Secondary | ICD-10-CM

## 2020-04-06 DIAGNOSIS — R05 Cough: Secondary | ICD-10-CM | POA: Insufficient documentation

## 2020-04-06 DIAGNOSIS — R0600 Dyspnea, unspecified: Secondary | ICD-10-CM | POA: Diagnosis present

## 2020-04-06 DIAGNOSIS — J029 Acute pharyngitis, unspecified: Secondary | ICD-10-CM | POA: Diagnosis present

## 2020-04-06 DIAGNOSIS — R0609 Other forms of dyspnea: Secondary | ICD-10-CM

## 2020-04-06 DIAGNOSIS — Z1152 Encounter for screening for COVID-19: Secondary | ICD-10-CM | POA: Diagnosis present

## 2020-04-06 LAB — POCT RAPID STREP A (OFFICE): Rapid Strep A Screen: NEGATIVE

## 2020-04-06 MED ORDER — LIDOCAINE VISCOUS HCL 2 % MT SOLN: OROMUCOSAL | 0 refills | Status: AC

## 2020-04-06 MED ORDER — FLUTICASONE PROPIONATE 50 MCG/ACT NA SUSP: 2.0000 | Freq: Every day | NASAL | 0 refills | Status: AC

## 2020-04-06 MED ORDER — BENZONATATE 200 MG PO CAPS: 200.0000 mg | ORAL_CAPSULE | Freq: Three times a day (TID) | ORAL | 0 refills | Status: AC

## 2020-04-06 NOTE — ED Provider Notes (Signed)
[name]    CSN: [id number] Arrival date & time: 04/06/20  0919      History   Chief Complaint Chief Complaint  Patient presents with  . Cough    HPI Glynis Hunsucker is a 34 y.o. female.   34 year old female comes in for 3 day of URI symptoms. Cough, sore throat, nasal congestion, headache, fatigue. Nausea without vomiting. Dyspnea on exertion. Denies fever, chills, body aches. Denies abdominal pain, diarrhea. Denies loss of taste/smell. Never smoker. Denies history of asthma, inhaler use. Recent travels, no COVID vaccine.      History reviewed. No pertinent past medical history.  There are no problems to display for this patient.   History reviewed. No pertinent surgical history.  OB History   No obstetric history on file.      Home Medications    Prior to Admission medications   Medication Sig Start Date End Date Taking? Authorizing Provider  benzonatate (TESSALON) 200 MG capsule Take 1 capsule (200 mg total) by mouth every 8 (eight) hours. 04/06/20   Cathie Hoops, Alondra Vandeven V, PA-C  fluticasone (FLONASE) 50 MCG/ACT nasal spray Place 2 sprays into both nostrils daily. 04/06/20   Cathie Hoops, Macen Joslin V, PA-C  lidocaine (XYLOCAINE) 2 % solution 5-15 mL gurgle as needed 04/06/20   Belinda Fisher, PA-C    Family History History reviewed. No pertinent family history.  Social History Social History   Tobacco Use  . Smoking status: Never Smoker  . Smokeless tobacco: Never Used  Substance Use Topics  . Alcohol use: Yes    Comment: socially  . Drug use: No     Allergies   Patient has no known allergies.   Review of Systems Review of Systems  Reason unable to perform ROS: See HPI as above.     Physical Exam Triage Vital Signs ED Triage Vitals  Enc Vitals Group     BP 04/06/20 0946 108/68     Pulse Rate 04/06/20 0946 73     Resp 04/06/20 0946 16     Temp 04/06/20 0946 99 F (37.2 C)     Temp Source 04/06/20 0946 Oral     SpO2 04/06/20 0946 99 %     Weight --       Height --      Head Circumference --      Peak Flow --      Pain Score 04/06/20 0953 7     Pain Loc --      Pain Edu? --      Excl. in GC? --    No data found.  Updated Vital Signs BP 108/68 (BP Location: Left Arm)   Pulse 73   Temp 99 F (37.2 C) (Oral)   Resp 16   SpO2 99%   Physical Exam Constitutional:      General: She is not in acute distress.    Appearance: Normal appearance. She is well-developed. She is not ill-appearing, toxic-appearing or diaphoretic.  HENT:     Head: Normocephalic and atraumatic.     Right Ear: Tympanic membrane, ear canal and external ear normal. Tympanic membrane is not erythematous or bulging.     Left Ear: Tympanic membrane, ear canal and external ear normal. Tympanic membrane is not erythematous or bulging.     Nose:     Right Sinus: Maxillary sinus tenderness present. No frontal sinus tenderness.     Left Sinus: Maxillary sinus tenderness present. No frontal sinus tenderness.  Mouth/Throat:     Mouth: Mucous membranes are moist.     Pharynx: Oropharynx is clear. Uvula midline.  Eyes:     Conjunctiva/sclera: Conjunctivae normal.     Pupils: Pupils are equal, round, and reactive to light.  Cardiovascular:     Rate and Rhythm: Normal rate and regular rhythm.  Pulmonary:     Effort: Pulmonary effort is normal. No accessory muscle usage, prolonged expiration, respiratory distress or retractions.     Breath sounds: No decreased air movement or transmitted upper airway sounds. No decreased breath sounds.     Comments: LCTAB Musculoskeletal:     Cervical back: Normal range of motion and neck supple.  Skin:    General: Skin is warm and dry.  Neurological:     Mental Status: She is alert and oriented to person, place, and time.      UC Treatments / Results  Labs (all labs ordered are listed, but only abnormal results are displayed) Labs Reviewed  POCT RAPID STREP A (OFFICE) - Normal  NOVEL CORONAVIRUS, NAA     EKG   Radiology No results found.  Procedures Procedures (including critical care time)  Medications Ordered in UC Medications - No data to display  Initial Impression / Assessment and Plan / UC Course  I have reviewed the triage vital signs and the nursing notes.  Pertinent labs & imaging results that were available during my care of the patient were reviewed by me and considered in my medical decision making (see chart for details).    COVID PCR test ordered. Patient to quarantine until testing results return. No alarming signs on exam. LCTAB. Symptomatic treatment discussed.  Push fluids.  Return precautions given.  Patient expresses understanding and agrees to plan.  Final Clinical Impressions(s) / UC Diagnoses   Final diagnoses:  Encounter for screening for COVID-19  Cough  Sore throat  Dyspnea on exertion    ED Prescriptions    Medication Sig Dispense Auth. Provider   benzonatate (TESSALON) 200 MG capsule Take 1 capsule (200 mg total) by mouth every 8 (eight) hours. 21 capsule Ndeye Tenorio V, PA-C   lidocaine (XYLOCAINE) 2 % solution 5-15 mL gurgle as needed 150 mL Chelcee Korpi V, PA-C   fluticasone (FLONASE) 50 MCG/ACT nasal spray Place 2 sprays into both nostrils daily. 1 g Belinda Fisher, PA-C     PDMP not reviewed this encounter.   Belinda Fisher, PA-C 04/06/20 1023

## 2020-04-06 NOTE — ED Triage Notes (Signed)
Pt c/o cough, sore throat, nasal congestion, headache, and fatigue since Sunday. Denies covid vaccine.

## 2020-04-06 NOTE — Discharge Instructions (Addendum)
COVID PCR testing ordered. I would like you to quarantine until testing results. Tessalon for cough. Start lidocaine for sore throat, do not eat or drink for the next 40 mins after use as it can stunt your gag reflex. Start flonase nasal spray for nasal congestion/drainage. You can use over the counter nasal saline rinse such as neti pot for nasal congestion. Keep hydrated, your urine should be clear to pale yellow in color. Tylenol/motrin for fever and pain.  Tylenol/motrin for pain and fever. Keep hydrated, urine should be clear to pale yellow in color. If experiencing shortness of breath, trouble breathing, go to the emergency department for further evaluation needed.

## 2020-04-07 LAB — NOVEL CORONAVIRUS, NAA: SARS-CoV-2, NAA: DETECTED — AB

## 2020-04-07 LAB — SARS-COV-2, NAA 2 DAY TAT

## 2020-04-09 LAB — CULTURE, GROUP A STREP (THRC)

## 2020-12-23 ENCOUNTER — Other Ambulatory Visit: Payer: Self-pay

## 2020-12-23 ENCOUNTER — Encounter: Payer: Self-pay | Admitting: Emergency Medicine

## 2020-12-23 ENCOUNTER — Ambulatory Visit
Admission: EM | Admit: 2020-12-23 | Discharge: 2020-12-23 | Disposition: A | Discharge status: Home / Self Care | Payer: BC Managed Care – PPO | Attending: Family Medicine | Admitting: Family Medicine

## 2020-12-23 DIAGNOSIS — R112 Nausea with vomiting, unspecified: Secondary | ICD-10-CM | POA: Diagnosis present

## 2020-12-23 DIAGNOSIS — N10 Acute tubulo-interstitial nephritis: Secondary | ICD-10-CM | POA: Diagnosis not present

## 2020-12-23 LAB — POCT URINALYSIS DIP (MANUAL ENTRY)
Glucose, UA: 250 mg/dL — AB
Nitrite, UA: POSITIVE — AB
Protein Ur, POC: >=300 mg/dL — AB
Spec Grav, UA: 1.01 (ref 1.010–1.025)
Urobilinogen, UA: >=8 E.U./dL — AB
pH, UA: 5 (ref 5.0–8.0)

## 2020-12-23 MED ORDER — ONDANSETRON 4 MG PO TBDP
4.0000 mg | ORAL_TABLET | Freq: Once | ORAL | Status: AC
  Administered 2020-12-23: 4 mg via ORAL

## 2020-12-23 MED ORDER — ACETAMINOPHEN 325 MG PO TABS
650.0000 mg | ORAL_TABLET | Freq: Once | ORAL | Status: AC
  Administered 2020-12-23: 650 mg via ORAL

## 2020-12-23 MED ORDER — CEPHALEXIN 500 MG PO CAPS: 500.0000 mg | ORAL_CAPSULE | Freq: Two times a day (BID) | ORAL | 0 refills | Status: AC

## 2020-12-23 MED ORDER — ONDANSETRON HCL 4 MG PO TABS: 4.0000 mg | ORAL_TABLET | Freq: Four times a day (QID) | ORAL | 0 refills | Status: AC

## 2020-12-23 MED ORDER — NAPROXEN 375 MG PO TABS: 375.0000 mg | ORAL_TABLET | Freq: Two times a day (BID) | ORAL | 0 refills | Status: AC

## 2020-12-23 MED ORDER — CEFTRIAXONE SODIUM 500 MG IJ SOLR
500.0000 mg | Freq: Once | INTRAMUSCULAR | Status: AC
  Administered 2020-12-23: 500 mg via INTRAMUSCULAR

## 2020-12-23 NOTE — Discharge Instructions (Signed)
Continue to monitor temperature at home if your fever does not improve with Tylenol or ibuprofen following at least 2 doses of antibiotic therapy for if your pain becomes severe go immediately to the emergency department. Complete entire course of antibiotics.  I have cultured your urine to ensure that the medication you are prescribed will cure the bacterial growth present in your urine. Zofran prescribed for nausea.

## 2020-12-23 NOTE — ED Triage Notes (Signed)
Pt is present today with nausea, fatigue, vomiting, and lower back pain. Pt states there sx started Tuesday. Pt states at home covid test was negative,.

## 2020-12-23 NOTE — ED Provider Notes (Signed)
[name]    CSN: [id number] Arrival date & time: 12/23/20  1430      History   Chief Complaint Chief Complaint  Patient presents with  . Nausea  . Back Pain  . Fatigue  . Emesis    HPI Alexis Leonard is a 35 y.o. female.   HPI  Patient presents today for evaluation of nausea, left lower flank pain, vomitus, and fatigue.  On arrival patient has a fever of 101.2.  Patient endorses having sensation of chills however had not had a measurable fever at home.  Symptoms began 2 days ago and has progressively worsened.  She has been taken Azo and subsequently vomited after taking medication.  She also has had a negative COVID test 2 days ago.  She has been tolerating fluids but has poor appetite.  She has not had diarrhea.  She does not have a history of recurrent UTIs.  History reviewed. No pertinent past medical history.  There are no problems to display for this patient.   History reviewed. No pertinent surgical history.  OB History   No obstetric history on file.      Home Medications    Prior to Admission medications   Medication Sig Start Date End Date Taking? Authorizing Provider  valACYclovir (VALTREX) 500 MG tablet  06/13/17  Yes [provider]  benzonatate (TESSALON) 200 MG capsule Take 1 capsule (200 mg total) by mouth every 8 (eight) hours. 04/06/20   Cathie Hoops, Amy V, PA-C  fluticasone (FLONASE) 50 MCG/ACT nasal spray Place 2 sprays into both nostrils daily. 04/06/20   Cathie Hoops, Amy V, PA-C  lidocaine (XYLOCAINE) 2 % solution 5-15 mL gurgle as needed 04/06/20   Belinda Fisher, PA-C    Family History History reviewed. No pertinent family history.  Social History Social History   Tobacco Use  . Smoking status: Never Smoker  . Smokeless tobacco: Never Used  Substance Use Topics  . Alcohol use: Yes    Comment: socially  . Drug use: No     Allergies   Daucus carota   Review of Systems Review of Systems Pertinent negatives listed in  HPI  Physical Exam Triage Vital Signs ED Triage Vitals  Enc Vitals Group     BP 12/23/20 1513 135/65     Pulse Rate 12/23/20 1513 98     Resp 12/23/20 1513 17     Temp 12/23/20 1513 (!) 101.2 F (38.4 C)     Temp Source 12/23/20 1513 Oral     SpO2 12/23/20 1513 99 %     Weight --      Height --      Head Circumference --      Peak Flow --      Pain Score 12/23/20 1510 10     Pain Loc --      Pain Edu? --      Excl. in [place]? --    No data found.  Updated Vital Signs BP 135/65   Pulse 98   Temp (!) 101.2 F (38.4 C) (Oral)   Resp 17   SpO2 99%   Visual Acuity Right Eye Distance:   Left Eye Distance:   Bilateral Distance:    Right Eye Near:   Left Eye Near:    Bilateral Near:     Physical Exam General appearance: Alert, acutely ill-appearing, cooperative  Head: Normocephalic, without obvious abnormality, atraumatic Respiratory: Respirations even and unlabored, normal respiratory rate Heart: Rate and rhythm normal.  Abdomen:  BS +, no distention, no rebound tenderness, Left flank pain with palpation Extremities: No gross deformities Skin: Skin color, texture, turgor normal. No rashes seen  Psych: Appropriate mood and affect. Neurologic: GCS 15, normal coordination, normal gait UC Treatments / Results  Labs (all labs ordered are listed, but only abnormal results are displayed) Labs Reviewed  POCT URINALYSIS DIP (MANUAL ENTRY) - Abnormal; Notable for the following components:      Result Value   Color, UA orange (*)    Clarity, UA turbid (*)    Glucose, UA =250 (*)    Bilirubin, UA moderate (*)    Ketones, POC UA large (80) (*)    Blood, UA moderate (*)    Protein Ur, POC >=300 (*)    Urobilinogen, UA >=8.0 (*)    Nitrite, UA Positive (*)    Leukocytes, UA Large (3+) (*)    All other components within normal limits  URINE CULTURE    EKG   Radiology No results found.  Procedures Procedures (including critical care time)  Medications Ordered in  UC Medications  acetaminophen (TYLENOL) tablet 650 mg (650 mg Oral Given 12/23/20 1528)  ondansetron (ZOFRAN-ODT) disintegrating tablet 4 mg (4 mg Oral Given 12/23/20 1546)    Initial Impression / Assessment and Plan / UC Course  I have reviewed the triage vital signs and the nursing notes.  Pertinent labs & imaging results that were available during my care of the patient were reviewed by me and considered in my medical decision making (see chart for details).    Patient presents today with nausea, vomiting and right flank pain treating for acute pyelonephritis.  Rocephin 500 mg IM given here in clinic today along with Tylenol and Zofran.  Patient discharged home on Keflex.  Red flag precautions given indicating the need to be evaluated in a higher level of care.  Patient verbalized understanding agreement with plan. Final Clinical Impressions(s) / UC Diagnoses   Final diagnoses:  Acute pyelonephritis  Nausea and vomiting, intractability of vomiting not specified, unspecified vomiting type     Discharge Instructions     Continue to monitor temperature at home if your fever does not improve with Tylenol or ibuprofen following at least 2 doses of antibiotic therapy for if your pain becomes severe go immediately to the emergency department. Complete entire course of antibiotics.  I have cultured your urine to ensure that the medication you are prescribed will cure the bacterial growth present in your urine. Zofran prescribed for nausea.    ED Prescriptions    Medication Sig Dispense Auth. Provider   cephALEXin (KEFLEX) 500 MG capsule Take 1 capsule (500 mg total) by mouth 2 (two) times daily for 10 days. 20 capsule Bing Neighbors, FNP   ondansetron (ZOFRAN) 4 MG tablet Take 1 tablet (4 mg total) by mouth every 6 (six) hours. 12 tablet Bing Neighbors, FNP   naproxen (NAPROSYN) 375 MG tablet Take 1 tablet (375 mg total) by mouth 2 (two) times daily. 20 tablet Bing Neighbors, FNP      PDMP not reviewed this encounter.   Bing Neighbors, FNP 12/23/20 1622

## 2020-12-24 LAB — URINE CULTURE: Culture: <10000 — AB

## 2021-09-12 ENCOUNTER — Encounter: Payer: Self-pay | Admitting: Emergency Medicine

## 2021-09-12 ENCOUNTER — Other Ambulatory Visit: Payer: Self-pay

## 2021-09-12 ENCOUNTER — Ambulatory Visit
Admission: EM | Admit: 2021-09-12 | Discharge: 2021-09-12 | Disposition: A | Discharge status: Home / Self Care | Payer: BC Managed Care – PPO

## 2021-09-12 DIAGNOSIS — M5442 Lumbago with sciatica, left side: Secondary | ICD-10-CM

## 2021-09-12 MED ORDER — PREDNISONE 20 MG PO TABS: 40.0000 mg | ORAL_TABLET | Freq: Every day | ORAL | 0 refills | Status: AC

## 2021-09-12 MED ORDER — CYCLOBENZAPRINE HCL 5 MG PO TABS: 5.0000 mg | ORAL_TABLET | Freq: Two times a day (BID) | ORAL | 0 refills | Status: DC | PRN

## 2021-09-12 NOTE — ED Provider Notes (Signed)
[name]    CSN: [id number] Arrival date & time: 09/12/21  0805      History   Chief Complaint Chief Complaint  Patient presents with   Back Pain    HPI Alexis Leonard is a 36 y.o. female.   Patient presents with left lower back pain that radiates down left leg that has been present for approximately 3 weeks.  Patient denies any apparent injury.  Denies history of the same.  Also has associated numbness and tingling down the backside of the leg.  Patient denies any urinary burning, urinary frequency, vaginal discharge, hematuria.  Reports that she does have history of urinary tract infections, and this feels different.  She took naproxen that was previously prescribed and has been using heating pads with minimal improvement in symptoms.  Pain is exacerbated by movement especially when bending over.   Back Pain  History reviewed. No pertinent past medical history.  There are no problems to display for this patient.   History reviewed. No pertinent surgical history.  OB History   No obstetric history on file.      Home Medications    Prior to Admission medications   Medication Sig Start Date End Date Taking? Authorizing Provider  cyclobenzaprine (FLEXERIL) 5 MG tablet Take 1 tablet (5 mg total) by mouth 2 (two) times daily as needed for muscle spasms. 09/12/21  Yes Jayla Mackie, Michele Rockers, FNP  levonorgestrel (MIRENA) 20 MCG/DAY IUD Mirena 20 mcg/24 hr (5 years) intrauterine device  Take by intrauterine route. 08/25/13  Yes [provider]  predniSONE (DELTASONE) 20 MG tablet Take 2 tablets (40 mg total) by mouth daily for 5 days. 09/12/21 09/17/21 Yes Nunzio Banet, Michele Rockers, FNP  valACYclovir (VALTREX) 500 MG tablet  06/13/17  Yes [provider]  benzonatate (TESSALON) 200 MG capsule Take 1 capsule (200 mg total) by mouth every 8 (eight) hours. 04/06/20   Tasia Catchings, Amy V, PA-C  fluticasone (FLONASE) 50 MCG/ACT nasal spray Place 2 sprays into both nostrils daily.  04/06/20   Tasia Catchings, Amy V, PA-C  lidocaine (XYLOCAINE) 2 % solution 5-15 mL gurgle as needed 04/06/20   Tasia Catchings, Amy V, PA-C  naproxen (NAPROSYN) 375 MG tablet Take 1 tablet (375 mg total) by mouth 2 (two) times daily. 12/23/20   Scot Jun, FNP  ondansetron (ZOFRAN) 4 MG tablet Take 1 tablet (4 mg total) by mouth every 6 (six) hours. 12/23/20   Scot Jun, FNP    Family History No family history on file.  Social History Social History   Tobacco Use   Smoking status: Never   Smokeless tobacco: Never  Substance Use Topics   Alcohol use: Yes    Comment: socially   Drug use: No     Allergies   Daucus carota   Review of Systems Review of Systems Per HPI  Physical Exam Triage Vital Signs ED Triage Vitals [09/12/21 0826]  Enc Vitals Group     BP 116/75     Pulse Rate 75     Resp 18     Temp 98.2 F (36.8 C)     Temp Source Oral     SpO2 100 %     Weight 175 lb (79.4 kg)     Height 5\' 6"  (1.676 m)     Head Circumference      Peak Flow      Pain Score 6     Pain Loc      Pain Edu?  Excl. in [place]?    No data found.  Updated Vital Signs BP 116/75 (BP Location: Left Arm)    Pulse 75    Temp 98.2 F (36.8 C) (Oral)    Resp 18    Ht 5\' 6"  (1.676 m)    Wt 175 lb (79.4 kg)    SpO2 100%    BMI 28.25 kg/m   Visual Acuity Right Eye Distance:   Left Eye Distance:   Bilateral Distance:    Right Eye Near:   Left Eye Near:    Bilateral Near:     Physical Exam Constitutional:      General: She is not in acute distress.    Appearance: Normal appearance. She is not toxic-appearing or diaphoretic.  HENT:     Head: Normocephalic and atraumatic.  Eyes:     Extraocular Movements: Extraocular movements intact.     Conjunctiva/sclera: Conjunctivae normal.  Pulmonary:     Effort: Pulmonary effort is normal.  Musculoskeletal:     Cervical back: Normal.     Thoracic back: Normal.     Lumbar back: Tenderness present. No swelling, edema or bony tenderness. Positive left  straight leg raise test. Negative right straight leg raise test.     Comments: No tenderness to palpation throughout left lower lumbar region.  Patient reports that pain is reproducible with movement.  No direct spinal tenderness, crepitus, step-off.  Neurological:     General: No focal deficit present.     Mental Status: She is alert and oriented to person, place, and time. Mental status is at baseline.     Deep Tendon Reflexes: Reflexes are normal and symmetric.  Psychiatric:        Mood and Affect: Mood normal.        Behavior: Behavior normal.        Thought Content: Thought content normal.        Judgment: Judgment normal.     UC Treatments / Results  Labs (all labs ordered are listed, but only abnormal results are displayed) Labs Reviewed - No data to display  EKG   Radiology No results found.  Procedures Procedures (including critical care time)  Medications Ordered in UC Medications - No data to display  Initial Impression / Assessment and Plan / UC Course  I have reviewed the triage vital signs and the nursing notes.  Pertinent labs & imaging results that were available during my care of the patient were reviewed by me and considered in my medical decision making (see chart for details).     Physical exam and patient symptoms are most consistent with lower back pain with sciatica.  Do not think that urinary tract infection is related to symptoms given the patient does not have any urinary symptoms.  Although, patient was offered urinalysis evaluation but declined.  Will treat with prednisone and muscle relaxer since pain has been refractory to NSAIDs.  Patient to continue alternating ice and heat application.  Discussed return precautions.  Patient verbalized understanding and was agreeable with plan. Final Clinical Impressions(s) / UC Diagnoses   Final diagnoses:  Acute left-sided low back pain with left-sided sciatica     Discharge Instructions      It  appears that you have low back pain with sciatica.  You have been prescribed 2 medications to help alleviate the symptoms.  Please be advised that the muscle relaxer (cyclobenzaprine) can cause drowsiness.  Do not drive while taking this medication.  Follow-up if symptoms persist or  worsen.    ED Prescriptions     Medication Sig Dispense Auth. Provider   predniSONE (DELTASONE) 20 MG tablet Take 2 tablets (40 mg total) by mouth daily for 5 days. 10 tablet [place], [place], [place]   cyclobenzaprine (FLEXERIL) 5 MG tablet Take 1 tablet (5 mg total) by mouth 2 (two) times daily as needed for muscle spasms. 20 tablet [place], [place], [place]      I have reviewed the PDMP during this encounter.   Teodora Medici, [place] 09/12/21 [phone]

## 2021-09-12 NOTE — Discharge Instructions (Signed)
It appears that you have low back pain with sciatica.  You have been prescribed 2 medications to help alleviate the symptoms.  Please be advised that the muscle relaxer (cyclobenzaprine) can cause drowsiness.  Do not drive while taking this medication.  Follow-up if symptoms persist or worsen.

## 2021-09-12 NOTE — ED Triage Notes (Signed)
Patient c/o low back pain when bending over, radiates down left leg, pain is more on the left side x 3 weeks.  No injury, no urinary sx's.

## 2023-02-26 ENCOUNTER — Ambulatory Visit
Admission: EM | Admit: 2023-02-26 | Discharge: 2023-02-26 | Disposition: A | Discharge status: Home / Self Care | Payer: BC Managed Care – PPO | Attending: Internal Medicine | Admitting: Internal Medicine

## 2023-02-26 DIAGNOSIS — G43909 Migraine, unspecified, not intractable, without status migrainosus: Secondary | ICD-10-CM

## 2023-02-26 DIAGNOSIS — R110 Nausea: Secondary | ICD-10-CM | POA: Diagnosis not present

## 2023-02-26 MED ORDER — SUMATRIPTAN SUCCINATE 50 MG PO TABS: 50.0000 mg | ORAL_TABLET | Freq: Once | ORAL | 0 refills | Status: AC | PRN

## 2023-02-26 MED ORDER — ONDANSETRON 4 MG PO TBDP: 4.0000 mg | ORAL_TABLET | Freq: Three times a day (TID) | ORAL | 0 refills | Status: AC | PRN

## 2023-02-26 MED ORDER — KETOROLAC TROMETHAMINE 30 MG/ML IJ SOLN
30.0000 mg | Freq: Once | INTRAMUSCULAR | Status: AC
  Administered 2023-02-26: 30 mg via INTRAMUSCULAR

## 2023-02-26 MED ORDER — ONDANSETRON 4 MG PO TBDP
4.0000 mg | ORAL_TABLET | Freq: Once | ORAL | Status: AC
  Administered 2023-02-26: 4 mg via ORAL

## 2023-02-26 NOTE — Discharge Instructions (Addendum)
You were given a Toradol injection in clinic today. Do not take any over the counter NSAID's such as Advil, ibuprofen, Aleve, or naproxen for 24 hours.  You may take tylenol if needed Trial of Imitrex as needed for migraine.  If you take 1 dose at onset of your headache and may repeat in 2 hours if symptoms persist.  Do not take more than 2 doses in 24 hours. Zofran has been sent to your pharmacy as needed for nausea.  You may take this every 8 hours as needed. Keep a headache diary and take to your PCP once you have established with 1.  Lots of rest and fluids.  Please go to the emergency room if you develop any worsening symptoms.  I hope you feel better soon!

## 2023-02-26 NOTE — ED Provider Notes (Signed)
[facility]    CSN: [id number] Arrival date & time: 02/26/23  1043      History   Chief Complaint Chief Complaint  Patient presents with   Headache    HPI Alexis Leonard is a 37 y.o. female presents for evaluation of headache.  Patient reports over the past 1 to 2 weeks she has had an intermittent unilateral headache that is a throbbing type headache that is associated with photosensitivity, nausea, dizziness.  Denies any visual changes, syncope, vomiting, unilateral weakness, neck pain.  Did states she had a nosebleed yesterday after blowing her nose but this has not reoccurred.  Reports she has a history of headaches including migraines.  She normally takes [facility] powder OTC which usually resolves her headache or she will sometimes take Excedrin Migraine.  She also reports she has a history of chronic low back pain and nausea for the past several years.  Reports she was evaluated for this sometime ago with no identifiable cause.  Denies any change in her nausea or back pain.  She does not currently have a PCP but is in search of 1.  She has not taken any OTC medications for her headache today.  No other concerns at this time.   Headache Associated symptoms: dizziness, nausea and photophobia     History reviewed. No pertinent past medical history.  There are no problems to display for this patient.   History reviewed. No pertinent surgical history.  OB History   No obstetric history on file.      Home Medications    Prior to Admission medications   Medication Sig Start Date End Date Taking? Authorizing Provider  ondansetron (ZOFRAN-ODT) 4 MG disintegrating tablet Take 1 tablet (4 mg total) by mouth every 8 (eight) hours as needed for nausea or vomiting. 02/26/23  Yes Radford Pax, NP  SUMAtriptan (IMITREX) 50 MG tablet Take 1 tablet (50 mg total) by mouth once as needed for up to 1 dose for migraine. May repeat in 2 hours if headache persists or recurs.  Do not exceed  more than 2 doses in 24 hours. 02/26/23  Yes Radford Pax, NP  benzonatate (TESSALON) 200 MG capsule Take 1 capsule (200 mg total) by mouth every 8 (eight) hours. 04/06/20   Cathie Hoops, Amy V, PA-C  cyclobenzaprine (FLEXERIL) 5 MG tablet Take 1 tablet (5 mg total) by mouth 2 (two) times daily as needed for muscle spasms. 09/12/21   Gustavus Bryant, FNP  fluticasone (FLONASE) 50 MCG/ACT nasal spray Place 2 sprays into both nostrils daily. 04/06/20   Belinda Fisher, PA-C  levonorgestrel (MIRENA) 20 MCG/DAY IUD Mirena 20 mcg/24 hr (5 years) intrauterine device  Take by intrauterine route. 08/25/13   [provider]  lidocaine (XYLOCAINE) 2 % solution 5-15 mL gurgle as needed 04/06/20   Cathie Hoops, Amy V, PA-C  naproxen (NAPROSYN) 375 MG tablet Take 1 tablet (375 mg total) by mouth 2 (two) times daily. 12/23/20   Bing Neighbors, NP  ondansetron (ZOFRAN) 4 MG tablet Take 1 tablet (4 mg total) by mouth every 6 (six) hours. 12/23/20   Bing Neighbors, NP  valACYclovir (VALTREX) 500 MG tablet  06/13/17   [provider]    Family History History reviewed. No pertinent family history.  Social History Social History   Tobacco Use   Smoking status: Never   Smokeless tobacco: Never  Substance Use Topics   Alcohol use: Yes    Comment: socially   Drug  use: No     Allergies   Daucus carota   Review of Systems Review of Systems  Eyes:  Positive for photophobia.  Gastrointestinal:  Positive for nausea.  Neurological:  Positive for dizziness and headaches.     Physical Exam Triage Vital Signs ED Triage Vitals  Enc Vitals Group     BP 02/26/23 1057 119/76     Pulse Rate 02/26/23 1056 74     Resp 02/26/23 1056 17     Temp 02/26/23 1056 98.7 F (37.1 C)     Temp Source 02/26/23 1056 Oral     SpO2 02/26/23 1056 98 %     Weight --      Height --      Head Circumference --      Peak Flow --      Pain Score 02/26/23 1056 6     Pain Loc --      Pain Edu? --      Excl. in [place]? --    No data  found.  Updated Vital Signs BP 119/76   Pulse 74   Temp 98.7 F (37.1 C) (Oral)   Resp 17   SpO2 98%   Visual Acuity Right Eye Distance:   Left Eye Distance:   Bilateral Distance:    Right Eye Near:   Left Eye Near:    Bilateral Near:     Physical Exam Vitals and nursing note reviewed.  Constitutional:      General: She is not in acute distress.    Appearance: Normal appearance. She is not ill-appearing, toxic-appearing or diaphoretic.  HENT:     Head: Normocephalic and atraumatic.  Eyes:     Extraocular Movements: Extraocular movements intact.     Conjunctiva/sclera: Conjunctivae normal.     Pupils: Pupils are equal, round, and reactive to light.  Cardiovascular:     Rate and Rhythm: Normal rate.  Pulmonary:     Effort: Pulmonary effort is normal.  Skin:    General: Skin is warm and dry.  Neurological:     General: No focal deficit present.     Mental Status: She is alert and oriented to person, place, and time.     GCS: GCS eye subscore is 4. GCS verbal subscore is 5. GCS motor subscore is 6.     Cranial Nerves: No facial asymmetry.     Motor: No weakness or pronator drift.     Coordination: Romberg sign negative.  Psychiatric:        Mood and Affect: Mood normal.        Behavior: Behavior normal.      UC Treatments / Results  Labs (all labs ordered are listed, but only abnormal results are displayed) Labs Reviewed - No data to display  EKG   Radiology No results found.  Procedures Procedures (including critical care time)  Medications Ordered in UC Medications  ondansetron (ZOFRAN-ODT) disintegrating tablet 4 mg (has no administration in time range)  ketorolac (TORADOL) 30 MG/ML injection 30 mg (has no administration in time range)    Initial Impression / Assessment and Plan / UC Course  I have reviewed the triage vital signs and the nursing notes.  Pertinent labs & imaging results that were available during my care of the patient were  reviewed by me and considered in my medical decision making (see chart for details).     Reviewed exam and symptoms with patient.  No red flags.  IM Toradol given in clinic.  She was monitored for 10 minutes after injection with no reaction noted and tolerated well.  No NSAIDs for 24 hours and she verbalized understanding.  Zofran ODT given in clinic and Rx sent to pharmacy.  Will do trial of Imitrex as needed for her migraines.  Patient will continue search for a PCP.  Advised to keep a headache diary and take to her PCP once established.  Encourage rest and fluids.  ER precautions reviewed and patient verbalized understanding. Final Clinical Impressions(s) / UC Diagnoses   Final diagnoses:  Migraine without status migrainosus, not intractable, unspecified migraine type  Nausea     Discharge Instructions      You were given a Toradol injection in clinic today. Do not take any over the counter NSAID's such as Advil, ibuprofen, Aleve, or naproxen for 24 hours.  You may take tylenol if needed Trial of Imitrex as needed for migraine.  If you take 1 dose at onset of your headache and may repeat in 2 hours if symptoms persist.  Do not take more than 2 doses in 24 hours. Zofran has been sent to your pharmacy as needed for nausea.  You may take this every 8 hours as needed. Keep a headache diary and take to your PCP once you have established with 1.  Lots of rest and fluids.  Please go to the emergency room if you develop any worsening symptoms.  I hope you feel better soon!      ED Prescriptions     Medication Sig Dispense Auth. Provider   ondansetron (ZOFRAN-ODT) 4 MG disintegrating tablet Take 1 tablet (4 mg total) by mouth every 8 (eight) hours as needed for nausea or vomiting. 20 tablet Radford Pax, NP   SUMAtriptan (IMITREX) 50 MG tablet Take 1 tablet (50 mg total) by mouth once as needed for up to 1 dose for migraine. May repeat in 2 hours if headache persists or recurs.  Do not  exceed more than 2 doses in 24 hours. 10 tablet Radford Pax, NP      PDMP not reviewed this encounter.   Radford Pax, NP 02/26/23 [phone]

## 2023-02-26 NOTE — ED Triage Notes (Signed)
Pt presents with c/o headaches, back pain and nausea X 1-2 weeks.   States she had a nosebleed yesterday.   Home interventions: extra strength tylenol and BC powder. Pt states the back pain and nausea are sporadic and she treats it as the pain comes.

## 2023-03-18 ENCOUNTER — Encounter: Payer: Self-pay | Admitting: Emergency Medicine

## 2023-03-18 ENCOUNTER — Ambulatory Visit
Admission: EM | Admit: 2023-03-18 | Discharge: 2023-03-18 | Disposition: A | Discharge status: Home / Self Care | Payer: BC Managed Care – PPO | Attending: Physician Assistant | Admitting: Physician Assistant

## 2023-03-18 DIAGNOSIS — H6122 Impacted cerumen, left ear: Secondary | ICD-10-CM

## 2023-03-18 HISTORY — DX: Migraine, unspecified, not intractable, without status migrainosus: G43.909

## 2023-03-18 NOTE — ED Triage Notes (Signed)
Pt c/o left ear fullness x 2 weeks. Difficulty hearing and feels like she is underwater. Pt states she has been using OTC ear drops.

## 2023-03-18 NOTE — ED Provider Notes (Signed)
[facility]    CSN: [id number] Arrival date & time: 03/18/23  1342      History   Chief Complaint Chief Complaint  Patient presents with   Ear Fullness    HPI Alexis Leonard is a 37 y.o. female.   Patient complains of left ear fullness and muffled hearing that started about 2 weeks ago.  She denies fever, chills, congestion, cough.  She is tried over-the-counter eardrops with no improvement.  She denies ear discharge.  She is not been swimming a lot recently.    Past Medical History:  Diagnosis Date   Migraines     There are no problems to display for this patient.   History reviewed. No pertinent surgical history.  OB History   No obstetric history on file.      Home Medications    Prior to Admission medications   Medication Sig Start Date End Date Taking? Authorizing Provider  benzonatate (TESSALON) 200 MG capsule Take 1 capsule (200 mg total) by mouth every 8 (eight) hours. 04/06/20   Cathie Hoops, Amy V, PA-C  cyclobenzaprine (FLEXERIL) 5 MG tablet Take 1 tablet (5 mg total) by mouth 2 (two) times daily as needed for muscle spasms. 09/12/21   Gustavus Bryant, FNP  fluticasone (FLONASE) 50 MCG/ACT nasal spray Place 2 sprays into both nostrils daily. 04/06/20   Belinda Fisher, PA-C  levonorgestrel (MIRENA) 20 MCG/DAY IUD Mirena 20 mcg/24 hr (5 years) intrauterine device  Take by intrauterine route. 08/25/13   [provider]  lidocaine (XYLOCAINE) 2 % solution 5-15 mL gurgle as needed 04/06/20   Cathie Hoops, Amy V, PA-C  naproxen (NAPROSYN) 375 MG tablet Take 1 tablet (375 mg total) by mouth 2 (two) times daily. 12/23/20   Bing Neighbors, NP  ondansetron (ZOFRAN) 4 MG tablet Take 1 tablet (4 mg total) by mouth every 6 (six) hours. 12/23/20   Bing Neighbors, NP  ondansetron (ZOFRAN-ODT) 4 MG disintegrating tablet Take 1 tablet (4 mg total) by mouth every 8 (eight) hours as needed for nausea or vomiting. 02/26/23   Radford Pax, NP  SUMAtriptan (IMITREX) 50 MG tablet Take 1  tablet (50 mg total) by mouth once as needed for up to 1 dose for migraine. May repeat in 2 hours if headache persists or recurs.  Do not exceed more than 2 doses in 24 hours. 02/26/23   Radford Pax, NP  valACYclovir (VALTREX) 500 MG tablet  06/13/17   [provider]    Family History No family history on file.  Social History Social History   Tobacco Use   Smoking status: Never   Smokeless tobacco: Never  Substance Use Topics   Alcohol use: Yes    Comment: socially   Drug use: No     Allergies   Daucus carota   Review of Systems Review of Systems  Constitutional:  Negative for chills and fever.  HENT:  Positive for ear pain. Negative for sore throat.   Eyes:  Negative for pain and visual disturbance.  Respiratory:  Negative for cough and shortness of breath.   Cardiovascular:  Negative for chest pain and palpitations.  Gastrointestinal:  Negative for abdominal pain and vomiting.  Genitourinary:  Negative for dysuria and hematuria.  Musculoskeletal:  Negative for arthralgias and back pain.  Skin:  Negative for color change and rash.  Neurological:  Negative for seizures and syncope.  All other systems reviewed and are negative.    Physical Exam Triage Vital  Signs ED Triage Vitals  Encounter Vitals Group     BP 03/18/23 1358 121/73     Systolic BP Percentile --      Diastolic BP Percentile --      Pulse Rate 03/18/23 1358 80     Resp --      Temp 03/18/23 1358 98.1 F (36.7 C)     Temp Source 03/18/23 1358 Oral     SpO2 03/18/23 1358 98 %     Weight --      Height --      Head Circumference --      Peak Flow --      Pain Score 03/18/23 1356 0     Pain Loc --      Pain Education --      Exclude from Growth Chart --    No data found.  Updated Vital Signs BP 121/73 (BP Location: Left Arm)   Pulse 80   Temp 98.1 F (36.7 C) (Oral)   SpO2 98%   Visual Acuity Right Eye Distance:   Left Eye Distance:   Bilateral Distance:    Right Eye  Near:   Left Eye Near:    Bilateral Near:     Physical Exam Vitals and nursing note reviewed.  Constitutional:      General: She is not in acute distress.    Appearance: She is well-developed.  HENT:     Head: Normocephalic and atraumatic.     Right Ear: Hearing and tympanic membrane normal.     Left Ear: There is impacted cerumen.  Eyes:     Conjunctiva/sclera: Conjunctivae normal.  Cardiovascular:     Rate and Rhythm: Normal rate and regular rhythm.     Heart sounds: No murmur heard. Pulmonary:     Effort: Pulmonary effort is normal. No respiratory distress.     Breath sounds: Normal breath sounds.  Abdominal:     Palpations: Abdomen is soft.     Tenderness: There is no abdominal tenderness.  Musculoskeletal:        General: No swelling.     Cervical back: Neck supple.  Skin:    General: Skin is warm and dry.     Capillary Refill: Capillary refill takes less than 2 seconds.  Neurological:     Mental Status: She is alert.  Psychiatric:        Mood and Affect: Mood normal.      UC Treatments / Results  Labs (all labs ordered are listed, but only abnormal results are displayed) Labs Reviewed - No data to display  EKG   Radiology No results found.  Procedures Procedures (including critical care time)  Medications Ordered in UC Medications - No data to display  Initial Impression / Assessment and Plan / UC Course  I have reviewed the triage vital signs and the nursing notes.  Pertinent labs & imaging results that were available during my care of the patient were reviewed by me and considered in my medical decision making (see chart for details).     Left cerumen impaction.  TM visualized and normal after ear lavage.  Patient reports she feels better after having ear flushed.  Return precautions discussed. Final Clinical Impressions(s) / UC Diagnoses   Final diagnoses:  Impacted cerumen of left ear   Discharge Instructions   None    ED Prescriptions    None    PDMP not reviewed this encounter.   Ward, Tylene Fantasia, PA-C 03/18/23 1504

## 2024-04-29 ENCOUNTER — Ambulatory Visit
Admission: RE | Admit: 2024-04-29 | Discharge: 2024-04-29 | Disposition: A | Discharge status: Home / Self Care | Source: Ambulatory Visit | Attending: Family Medicine | Admitting: Family Medicine

## 2024-04-29 VITALS — BP 101/59 | HR 64 | Temp 99.8°F | Resp 16

## 2024-04-29 DIAGNOSIS — M5442 Lumbago with sciatica, left side: Secondary | ICD-10-CM | POA: Diagnosis not present

## 2024-04-29 DIAGNOSIS — S39012A Strain of muscle, fascia and tendon of lower back, initial encounter: Secondary | ICD-10-CM

## 2024-04-29 MED ORDER — MELOXICAM 15 MG PO TABS: 15.0000 mg | ORAL_TABLET | Freq: Every day | ORAL | 0 refills | Status: AC

## 2024-04-29 MED ORDER — CYCLOBENZAPRINE HCL 5 MG PO TABS: 5.0000 mg | ORAL_TABLET | Freq: Every evening | ORAL | 0 refills | Status: AC | PRN

## 2024-04-29 NOTE — ED Triage Notes (Signed)
 Pt reports back pain and bilateral leg pain after she sneezed yesterday.  States pain is worse today. Otc meds gives no relief.

## 2024-04-29 NOTE — ED Provider Notes (Signed)
 [facility]  Note:  This document was prepared using Conservation officer, historic buildings and may include unintentional dictation errors.  MRN: [id number] DOB: 03-Apr-1986  Subjective:   Alexis Leonard is a 38 y.o. female presenting for 1 day history of acute onset severe low back pain that is limiting her movement, use of her limbs.  Reports that symptoms started while she was driving and sneezed.  No fall, trauma, changes in bowel or urinary habits.  Has had sciatica before but does not feel any particular radicular symptoms now.  Has seen a chiropractor before she has chronic back pain.  Has never seen a spine specialist.  No current facility-administered medications for this encounter.  Current Outpatient Medications:    benzonatate  (TESSALON ) 200 MG capsule, Take 1 capsule (200 mg total) by mouth every 8 (eight) hours., Disp: 21 capsule, Rfl: 0   cyclobenzaprine  (FLEXERIL ) 5 MG tablet, Take 1 tablet (5 mg total) by mouth 2 (two) times daily as needed for muscle spasms., Disp: 20 tablet, Rfl: 0   fluticasone  (FLONASE ) 50 MCG/ACT nasal spray, Place 2 sprays into both nostrils daily., Disp: 1 g, Rfl: 0   levonorgestrel (MIRENA) 20 MCG/DAY IUD, Mirena 20 mcg/24 hr (5 years) intrauterine device  Take by intrauterine route., Disp: , Rfl:    lidocaine  (XYLOCAINE ) 2 % solution, 5-15 mL gurgle as needed, Disp: 150 mL, Rfl: 0   naproxen  (NAPROSYN ) 375 MG tablet, Take 1 tablet (375 mg total) by mouth 2 (two) times daily., Disp: 20 tablet, Rfl: 0   ondansetron  (ZOFRAN ) 4 MG tablet, Take 1 tablet (4 mg total) by mouth every 6 (six) hours., Disp: 12 tablet, Rfl: 0   ondansetron  (ZOFRAN -ODT) 4 MG disintegrating tablet, Take 1 tablet (4 mg total) by mouth every 8 (eight) hours as needed for nausea or vomiting., Disp: 20 tablet, Rfl: 0   SUMAtriptan  (IMITREX ) 50 MG tablet, Take 1 tablet (50 mg total) by mouth once as needed for up to 1 dose for migraine. May repeat in 2 hours if  headache persists or recurs.  Do not exceed more than 2 doses in 24 hours., Disp: 10 tablet, Rfl: 0   valACYclovir (VALTREX) 500 MG tablet, , Disp: , Rfl:    Allergies  Allergen Reactions   Daucus Carota Anaphylaxis    Past Medical History:  Diagnosis Date   Migraines      History reviewed. No pertinent surgical history.  History reviewed. No pertinent family history.  Social History   Tobacco Use   Smoking status: Never   Smokeless tobacco: Never  Vaping Use   Vaping status: Never Used  Substance Use Topics   Alcohol use: Yes    Comment: socially   Drug use: No    ROS   Objective:   Vitals: BP (!) 101/59 (BP Location: Left Arm)   Pulse 64   Temp 99.8 F (37.7 C) (Oral)   Resp 16   SpO2 96%   Physical Exam Constitutional:      General: She is not in acute distress.    Appearance: Normal appearance. She is well-developed. She is not ill-appearing, toxic-appearing or diaphoretic.  HENT:     Head: Normocephalic and atraumatic.     Nose: Nose normal.     Mouth/Throat:     Mouth: Mucous membranes are moist.  Eyes:     General: No scleral icterus.       Right eye: No discharge.        Left eye:  No discharge.     Extraocular Movements: Extraocular movements intact.  Cardiovascular:     Rate and Rhythm: Normal rate.  Pulmonary:     Effort: Pulmonary effort is normal.  Musculoskeletal:     Lumbar back: Decreased range of motion. Positive left straight leg raise test. Negative right straight leg raise test.     Comments: Unable to evaluate for the low back as patient was guarding extensively and could not lean forward for me to palpate and perform exam.   Skin:    General: Skin is warm and dry.  Neurological:     General: No focal deficit present.     Mental Status: She is alert and oriented to person, place, and time.     Motor: No weakness.     Coordination: Coordination abnormal.     Gait: Gait normal.     Deep Tendon Reflexes: Reflexes normal.   Psychiatric:        Mood and Affect: Mood normal.        Behavior: Behavior normal.     Assessment and Plan :   PDMP not reviewed this encounter.  1. Acute bilateral low back pain with left-sided sciatica   2. Lumbar strain, initial encounter    Recommended managing for a lumbar strain with aggravation of her sciatica.  Start meloxicam , use cyclobenzaprine .  Given severity of her symptoms, advised that she follow-up with a spine specialist soon as possible.  It is possible that patient has a bulging disc, ruptured disc, facet arthropathy, diskitis but will defer ER visit at this time.  Counseled patient on potential for adverse effects with medications prescribed/recommended today, ER and return-to-clinic precautions discussed, patient verbalized understanding.    Christopher Savannah, [place] 04/29/24 [id number]
